# Patient Record
Sex: Male | Born: 1980 | Race: Black or African American | Hispanic: No | Smoking: Current every day smoker
Health system: Southern US, Community
[De-identification: ages and names within clinical notes are randomized; demographics above are authoritative.]

---
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
## 2002-09-08 ENCOUNTER — Encounter: Payer: Self-pay | Admitting: Emergency Medicine

## 2002-09-08 ENCOUNTER — Emergency Department (HOSPITAL_COMMUNITY): Admission: EM | Admit: 2002-09-08 | Discharge: 2002-09-08 | Payer: Self-pay | Admitting: Emergency Medicine

## 2008-10-13 ENCOUNTER — Emergency Department (HOSPITAL_COMMUNITY): Admission: EM | Admit: 2008-10-13 | Discharge: 2008-10-14 | Payer: Self-pay | Admitting: Emergency Medicine

## 2008-10-21 ENCOUNTER — Emergency Department (HOSPITAL_COMMUNITY): Admission: EM | Admit: 2008-10-21 | Discharge: 2008-10-21 | Payer: Self-pay | Admitting: Emergency Medicine

## 2009-09-13 ENCOUNTER — Emergency Department (HOSPITAL_COMMUNITY): Admission: EM | Admit: 2009-09-13 | Discharge: 2009-09-13 | Payer: Self-pay | Admitting: Family Medicine

## 2012-04-01 ENCOUNTER — Encounter (HOSPITAL_COMMUNITY): Payer: Self-pay | Admitting: Emergency Medicine

## 2012-04-01 ENCOUNTER — Emergency Department (HOSPITAL_COMMUNITY)
Admission: EM | Admit: 2012-04-01 | Discharge: 2012-04-01 | Disposition: A | Discharge status: Home / Self Care | Payer: Medicaid Other | Source: Home / Self Care | Attending: Family Medicine | Admitting: Family Medicine

## 2012-04-01 DIAGNOSIS — S335XXA Sprain of ligaments of lumbar spine, initial encounter: Secondary | ICD-10-CM

## 2012-04-01 DIAGNOSIS — S39012A Strain of muscle, fascia and tendon of lower back, initial encounter: Secondary | ICD-10-CM

## 2012-04-01 MED ORDER — IBUPROFEN 800 MG PO TABS: 800.0000 mg | ORAL_TABLET | Freq: Three times a day (TID) | ORAL | Status: DC

## 2012-04-01 MED ORDER — CYCLOBENZAPRINE HCL 5 MG PO TABS: 5.0000 mg | ORAL_TABLET | Freq: Three times a day (TID) | ORAL | Status: DC | PRN

## 2012-04-01 MED ORDER — KETOROLAC TROMETHAMINE 30 MG/ML IJ SOLN
INTRAMUSCULAR | Status: AC
  Filled 2012-04-01: qty 1

## 2012-04-01 MED ORDER — KETOROLAC TROMETHAMINE 30 MG/ML IJ SOLN
60.0000 mg | Freq: Once | INTRAMUSCULAR | Status: AC
  Administered 2012-04-01: 60 mg via INTRAMUSCULAR

## 2012-04-01 NOTE — ED Provider Notes (Signed)
History     CSN: [id number]  Arrival date & time 04/01/12  1129   First MD Initiated Contact with Patient 04/01/12 1145      Chief Complaint  Patient presents with  . Back Pain    (Consider location/radiation/quality/duration/timing/severity/associated sxs/prior treatment) Patient is a 32 y.o. male presenting with back pain. The history is provided by the patient.  Back Pain Location:  Lumbar spine Quality:  Stabbing and shooting Radiates to:  L thigh and R thigh Pain severity:  Mild Onset quality:  Sudden (while hauling trees from yard after cutting yest.) Timing:  Constant Progression:  Unchanged Chronicity:  New Context: lifting heavy objects     History reviewed. No pertinent past medical history.  History reviewed. No pertinent past surgical history.  No family history on file.  History  Substance Use Topics  . Smoking status: Current Every Day Smoker  . Smokeless tobacco: Not on file  . Alcohol Use: Yes      Review of Systems  Constitutional: Negative.   Gastrointestinal: Negative.   Genitourinary: Negative.   Musculoskeletal: Positive for myalgias and back pain. Negative for gait problem.  Skin: Negative.     Allergies  Review of patient's allergies indicates no known allergies.  Home Medications   Current Outpatient Rx  Name  Route  Sig  Dispense  Refill  . OVER THE COUNTER MEDICATION      otc back cream         . cyclobenzaprine (FLEXERIL) 5 MG tablet   Oral   Take 1 tablet (5 mg total) by mouth 3 (three) times daily as needed for muscle spasms.   30 tablet   0   . ibuprofen (ADVIL,MOTRIN) 800 MG tablet   Oral   Take 1 tablet (800 mg total) by mouth 3 (three) times daily.   30 tablet   0     BP 128/82  Pulse 69  Temp(Src) 97.6 F (36.4 C) (Oral)  Resp 18  SpO2 97%  Physical Exam  Nursing note and vitals reviewed. Constitutional: He is oriented to person, place, and time. He appears well-developed and well-nourished.    Abdominal: Soft. Bowel sounds are normal.  Musculoskeletal: He exhibits tenderness.       Lumbar back: He exhibits decreased range of motion, tenderness and spasm. He exhibits normal pulse.       Back:  Neurological: He is alert and oriented to person, place, and time. He has normal reflexes. He displays normal reflexes. He exhibits normal muscle tone.  Skin: Skin is warm and dry.    ED Course  Procedures (including critical care time)  Labs Reviewed - No data to display No results found.   1. Strain of lumbar paraspinal muscle, initial encounter       MDM          Linna Hoff, MD 04/01/12 1259

## 2012-04-01 NOTE — ED Notes (Signed)
C/o back pain, onset yesterday while cutting trees.  Pain in low back and into both legs, but left lower back and left leg is the most painful.  Sharp pain in back apnd leg.  denies any history of back issues.  Denies any urinary symptoms

## 2016-08-01 ENCOUNTER — Ambulatory Visit (HOSPITAL_COMMUNITY)
Admission: EM | Admit: 2016-08-01 | Discharge: 2016-08-01 | Disposition: A | Discharge status: Home / Self Care | Payer: Medicaid Other | Attending: Family Medicine | Admitting: Family Medicine

## 2016-08-01 ENCOUNTER — Encounter (HOSPITAL_COMMUNITY): Payer: Self-pay | Admitting: Emergency Medicine

## 2016-08-01 DIAGNOSIS — K0889 Other specified disorders of teeth and supporting structures: Secondary | ICD-10-CM | POA: Diagnosis not present

## 2016-08-01 DIAGNOSIS — Z202 Contact with and (suspected) exposure to infections with a predominantly sexual mode of transmission: Secondary | ICD-10-CM | POA: Insufficient documentation

## 2016-08-01 DIAGNOSIS — F17200 Nicotine dependence, unspecified, uncomplicated: Secondary | ICD-10-CM | POA: Insufficient documentation

## 2016-08-01 DIAGNOSIS — Z79899 Other long term (current) drug therapy: Secondary | ICD-10-CM | POA: Insufficient documentation

## 2016-08-01 MED ORDER — KETOROLAC TROMETHAMINE 60 MG/2ML IM SOLN
60.0000 mg | Freq: Once | INTRAMUSCULAR | Status: AC
  Administered 2016-08-01: 60 mg via INTRAMUSCULAR

## 2016-08-01 MED ORDER — CEFTRIAXONE SODIUM 250 MG IJ SOLR
250.0000 mg | Freq: Once | INTRAMUSCULAR | Status: AC
  Administered 2016-08-01: 250 mg via INTRAMUSCULAR

## 2016-08-01 MED ORDER — KETOROLAC TROMETHAMINE 60 MG/2ML IM SOLN
INTRAMUSCULAR | Status: AC
  Filled 2016-08-01: qty 2

## 2016-08-01 MED ORDER — AZITHROMYCIN 250 MG PO TABS
1000.0000 mg | ORAL_TABLET | Freq: Once | ORAL | Status: AC
  Administered 2016-08-01: 1000 mg via ORAL

## 2016-08-01 MED ORDER — CEFTRIAXONE SODIUM 250 MG IJ SOLR
INTRAMUSCULAR | Status: AC
  Filled 2016-08-01: qty 250

## 2016-08-01 MED ORDER — AZITHROMYCIN 250 MG PO TABS
ORAL_TABLET | ORAL | Status: AC
  Filled 2016-08-01: qty 4

## 2016-08-01 MED ORDER — MELOXICAM 7.5 MG PO TABS: 7.5000 mg | ORAL_TABLET | Freq: Every day | ORAL | 0 refills | Status: AC

## 2016-08-01 NOTE — ED Notes (Signed)
Call back number verified and updated in EPIC... Adv pt to not have SI until lab results comeback neg.... Also adv pt lab results will be on MyChart; instructions given .... Pt verb understanding.   

## 2016-08-01 NOTE — ED Notes (Signed)
Dirty urine specimen obtained and in lab 
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
## 2016-08-01 NOTE — ED Triage Notes (Signed)
Complains of dental pain.  Pain in top left tooth.  Onset 2 weeks ago Patient denies penile discharge.  Told partner has gonorrhea.

## 2016-08-02 LAB — URINE CYTOLOGY ANCILLARY ONLY
Chlamydia: NEGATIVE
Neisseria Gonorrhea: NEGATIVE
Trichomonas: NEGATIVE

## 2016-11-12 ENCOUNTER — Encounter (HOSPITAL_COMMUNITY): Payer: Self-pay

## 2016-11-12 ENCOUNTER — Emergency Department (HOSPITAL_COMMUNITY)
Admission: EM | Admit: 2016-11-12 | Discharge: 2016-11-12 | Disposition: A | Discharge status: Home / Self Care | Payer: Medicaid Other | Attending: Emergency Medicine | Admitting: Emergency Medicine

## 2016-11-12 DIAGNOSIS — R51 Headache: Secondary | ICD-10-CM | POA: Diagnosis present

## 2016-11-12 DIAGNOSIS — F17200 Nicotine dependence, unspecified, uncomplicated: Secondary | ICD-10-CM | POA: Insufficient documentation

## 2016-11-12 DIAGNOSIS — Z79899 Other long term (current) drug therapy: Secondary | ICD-10-CM | POA: Insufficient documentation

## 2016-11-12 DIAGNOSIS — R55 Syncope and collapse: Secondary | ICD-10-CM | POA: Diagnosis not present

## 2016-11-12 DIAGNOSIS — R519 Headache, unspecified: Secondary | ICD-10-CM

## 2016-11-12 LAB — COMPREHENSIVE METABOLIC PANEL
ALT: 34 U/L (ref 17–63)
AST: 39 U/L (ref 15–41)
Albumin: 3.7 g/dL (ref 3.5–5.0)
Alkaline Phosphatase: 81 U/L (ref 38–126)
Anion gap: 8 (ref 5–15)
BUN: 5 mg/dL — ABNORMAL LOW (ref 6–20)
CO2: 26 mmol/L (ref 22–32)
Calcium: 9.1 mg/dL (ref 8.9–10.3)
Chloride: 104 mmol/L (ref 101–111)
Creatinine, Ser: 1.18 mg/dL (ref 0.61–1.24)
GFR calc Af Amer: >60 mL/min (ref >60)
GFR calc non Af Amer: >60 mL/min (ref >60)
Glucose, Bld: 125 mg/dL — ABNORMAL HIGH (ref 65–99)
Potassium: 3.9 mmol/L (ref 3.5–5.1)
Sodium: 138 mmol/L (ref 135–145)
Total Bilirubin: 0.6 mg/dL (ref 0.3–1.2)
Total Protein: 6.6 g/dL (ref 6.5–8.1)

## 2016-11-12 LAB — CBC
HCT: 43.4 % (ref 39.0–52.0)
Hemoglobin: 14.8 g/dL (ref 13.0–17.0)
MCH: 32 pg (ref 26.0–34.0)
MCHC: 34.1 g/dL (ref 30.0–36.0)
MCV: 93.9 fL (ref 78.0–100.0)
Platelets: 306 10*3/uL (ref 150–400)
RBC: 4.62 MIL/uL (ref 4.22–5.81)
RDW: 14.3 % (ref 11.5–15.5)
WBC: 5.5 10*3/uL (ref 4.0–10.5)

## 2016-11-12 LAB — LIPASE, BLOOD: Lipase: 28 U/L (ref 11–51)

## 2016-11-12 MED ORDER — OXYCODONE-ACETAMINOPHEN 5-325 MG PO TABS
1.0000 | ORAL_TABLET | Freq: Once | ORAL | Status: AC
  Administered 2016-11-12: 1 via ORAL
  Filled 2016-11-12: qty 1

## 2016-11-12 MED ORDER — ONDANSETRON 4 MG PO TBDP
ORAL_TABLET | ORAL | Status: AC
  Filled 2016-11-12: qty 1

## 2016-11-12 MED ORDER — IBUPROFEN 400 MG PO TABS
600.0000 mg | ORAL_TABLET | Freq: Once | ORAL | Status: AC
  Administered 2016-11-12: 13:00:00 600 mg via ORAL
  Filled 2016-11-12: qty 1

## 2016-11-12 MED ORDER — LORAZEPAM 1 MG PO TABS
1.0000 mg | ORAL_TABLET | Freq: Once | ORAL | Status: AC
  Administered 2016-11-12: 1 mg via ORAL
  Filled 2016-11-12: qty 1

## 2016-11-12 MED ORDER — ONDANSETRON 4 MG PO TBDP
4.0000 mg | ORAL_TABLET | Freq: Once | ORAL | Status: AC | PRN
  Administered 2016-11-12: 4 mg via ORAL

## 2016-11-12 NOTE — ED Notes (Signed)
Pt verbalized understanding discharge instructions and denies any further needs or questions at this time. VS stable, ambulatory and steady gait.  Pt informed not to drive since he was given meds.
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
## 2017-07-19 ENCOUNTER — Encounter (HOSPITAL_COMMUNITY): Payer: Self-pay | Admitting: Emergency Medicine

## 2017-07-19 ENCOUNTER — Emergency Department (HOSPITAL_COMMUNITY)
Admission: EM | Admit: 2017-07-19 | Discharge: 2017-07-19 | Disposition: A | Discharge status: Home / Self Care | Payer: Medicaid Other | Attending: Emergency Medicine | Admitting: Emergency Medicine

## 2017-07-19 ENCOUNTER — Other Ambulatory Visit: Payer: Self-pay

## 2017-07-19 DIAGNOSIS — M549 Dorsalgia, unspecified: Secondary | ICD-10-CM | POA: Diagnosis present

## 2017-07-19 DIAGNOSIS — Z79899 Other long term (current) drug therapy: Secondary | ICD-10-CM | POA: Insufficient documentation

## 2017-07-19 DIAGNOSIS — M5442 Lumbago with sciatica, left side: Secondary | ICD-10-CM | POA: Insufficient documentation

## 2017-07-19 DIAGNOSIS — F17210 Nicotine dependence, cigarettes, uncomplicated: Secondary | ICD-10-CM | POA: Insufficient documentation

## 2017-07-19 LAB — CBC WITH DIFFERENTIAL/PLATELET
Abs Immature Granulocytes: 0 10*3/uL (ref 0.0–0.1)
BASOS ABS: 0 10*3/uL (ref 0.0–0.1)
BASOS PCT: 1 %
EOS ABS: 0.2 10*3/uL (ref 0.0–0.7)
Eosinophils Relative: 4 %
HCT: 47.4 % (ref 39.0–52.0)
Hemoglobin: 15.7 g/dL (ref 13.0–17.0)
IMMATURE GRANULOCYTES: 0 %
Lymphocytes Relative: 39 %
Lymphs Abs: 2.1 10*3/uL (ref 0.7–4.0)
MCH: 31 pg (ref 26.0–34.0)
MCHC: 33.1 g/dL (ref 30.0–36.0)
MCV: 93.5 fL (ref 78.0–100.0)
MONOS PCT: 11 %
Monocytes Absolute: 0.6 10*3/uL (ref 0.1–1.0)
NEUTROS PCT: 45 %
Neutro Abs: 2.5 10*3/uL (ref 1.7–7.7)
PLATELETS: 269 10*3/uL (ref 150–400)
RBC: 5.07 MIL/uL (ref 4.22–5.81)
RDW: 13.1 % (ref 11.5–15.5)
WBC: 5.5 10*3/uL (ref 4.0–10.5)

## 2017-07-19 LAB — COMPREHENSIVE METABOLIC PANEL
ALK PHOS: 69 U/L (ref 38–126)
ALT: 54 U/L — AB (ref 0–44)
ANION GAP: 9 (ref 5–15)
AST: 27 U/L (ref 15–41)
Albumin: 3.9 g/dL (ref 3.5–5.0)
BUN: 8 mg/dL (ref 6–20)
CALCIUM: 9.9 mg/dL (ref 8.9–10.3)
CO2: 27 mmol/L (ref 22–32)
CREATININE: 1.27 mg/dL — AB (ref 0.61–1.24)
Chloride: 104 mmol/L (ref 98–111)
Glucose, Bld: 117 mg/dL — ABNORMAL HIGH (ref 70–99)
Potassium: 3.8 mmol/L (ref 3.5–5.1)
Sodium: 140 mmol/L (ref 135–145)
TOTAL PROTEIN: 7.1 g/dL (ref 6.5–8.1)
Total Bilirubin: 0.9 mg/dL (ref 0.3–1.2)

## 2017-07-19 LAB — LIPASE, BLOOD: LIPASE: 39 U/L (ref 11–51)

## 2017-07-19 MED ORDER — NAPROXEN 250 MG PO TABS
500.0000 mg | ORAL_TABLET | Freq: Once | ORAL | Status: AC
  Administered 2017-07-19: 500 mg via ORAL
  Filled 2017-07-19: qty 2

## 2017-07-19 MED ORDER — CYCLOBENZAPRINE HCL 10 MG PO TABS: 10.0000 mg | ORAL_TABLET | Freq: Two times a day (BID) | ORAL | 0 refills | Status: DC | PRN

## 2017-07-19 MED ORDER — NAPROXEN 500 MG PO TABS: 500.0000 mg | ORAL_TABLET | Freq: Two times a day (BID) | ORAL | 0 refills | Status: DC | PRN

## 2017-07-19 MED ORDER — CYCLOBENZAPRINE HCL 10 MG PO TABS
10.0000 mg | ORAL_TABLET | Freq: Once | ORAL | Status: AC
  Administered 2017-07-19: 10 mg via ORAL
  Filled 2017-07-19: qty 1

## 2017-07-19 NOTE — ED Notes (Signed)
During quick look, patient endorses abdominal pain, n/v.  Patient acuity will be upgraded, provided to order labs

## 2017-07-19 NOTE — Discharge Instructions (Signed)
Take Tylenol and naproxen as needed for pain. Use heat and/or muscle relaxant Flexeril for muscle spasm. Follow-up with local physician if no improvement in 2 weeks or if you develop weakness, numbness, bowel or bladder changes or fevers.  If you were given medicines take as directed.  If you are on coumadin or contraceptives realize their levels and effectiveness is altered by many different medicines.  If you have any reaction (rash, tongues swelling, other) to the medicines stop taking and see a physician.    If your blood pressure was elevated in the ER make sure you follow up for management with a primary doctor or return for chest pain, shortness of breath or stroke symptoms.  Please follow up as directed and return to the ER or see a physician for new or worsening symptoms.  Thank you. Vitals:   07/19/17 1431  BP: 140/79  Pulse: 65  Resp: 17  Temp: 98.5 F (36.9 C)  TempSrc: Oral  SpO2: 98%  Weight: 81.6 kg (180 lb)  Height: 5\' 6"  (1.676 m)

## 2017-07-19 NOTE — ED Triage Notes (Signed)
Pt to ER for evaluation of left lateral lumbar back pain radiating into left leg since Wednesday. Reports onset when he was helping someone move. Pt ambulatory. NAD

## 2017-07-19 NOTE — ED Provider Notes (Signed)
MSE was initiated and I personally evaluated the patient and placed orders (if any) at  2:38 PM on July 19, 2017.  The patient appears stable so that the remainder of the MSE may be completed by another provider.  Patient placed in Quick Look pathway, seen and evaluated   Chief Complaint: abdominal pain, back pain  HPI:   Patient presents to ED for 2-day history of left lower back pain.  He also reports right middle and upper quadrant abdominal pain that began this morning.  Several episodes of diarrhea yesterday.  Intermittent nausea.  No sick contacts with similar symptoms.  No suspicious food ingestions.  States that the back pain may have been from him lifting or moving a certain way.  He has had similar symptoms in the past but " this just keeps getting worse."  Denies any numbness in legs, loss of bowel or bladder function, changes in gait, fever.  ROS: Abdominal pain, back pain  Physical Exam:   Gen: No distress  Neuro: Awake and Alert  Skin: Warm    Focused Exam: Tenderness to palpation of the right mid and upper abdomen.  No rebound or guarding.  Left lower lumbar tenderness to palpation at the paraspinal musculature.  No midline tenderness.  No signs of saddle anesthesia or changes in sensation of lower extremities.   Initiation of care has begun. The patient has been counseled on the process, plan, and necessity for staying for the completion/evaluation, and the remainder of the medical screening examination    Dietrich PatesKhatri, Erie Radu, PA-C 07/19/17 1440    Raeford RazorKohut, Stephen, MD 07/21/17 1122

## 2017-07-19 NOTE — ED Provider Notes (Signed)
[facility] EMERGENCY DEPARTMENT Provider Note   CSN: [id number] Arrival date & time: 07/19/17  1423     History   Chief Complaint Chief Complaint  Patient presents with  . Back Pain    HPI Denise Bramblett is a 37 y.o. male.  Patient presents with primarily  left lower back pain with mild radiation down the buttocks and left leg.  This is worsened since moving a garbage bag recently.  Patient had transient right flank pain however primarily left back pain is why he is here.  No fevers or chills.  No vomiting.  No weakness or numbness.  Pain worse with movement.     History reviewed. No pertinent past medical history.  There are no active problems to display for this patient.   History reviewed. No pertinent surgical history.      Home Medications    Prior to Admission medications   Medication Sig Start Date End Date Taking? Authorizing Provider  cyclobenzaprine (FLEXERIL) 10 MG tablet Take 1 tablet (10 mg total) by mouth 2 (two) times daily as needed for muscle spasms. 07/19/17   Blane Ohara, MD  ibuprofen (ADVIL,MOTRIN) 800 MG tablet Take 1 tablet (800 mg total) by mouth 3 (three) times daily. 04/01/12   Linna Hoff, MD  naproxen (NAPROSYN) 500 MG tablet Take 1 tablet (500 mg total) by mouth 2 (two) times daily as needed. 07/19/17   Blane Ohara, MD  OVER THE COUNTER MEDICATION otc back cream    [provider]    Family History History reviewed. No pertinent family history.  Social History Social History   Tobacco Use  . Smoking status: Current Every Day Smoker  . Smokeless tobacco: Never Used  Substance Use Topics  . Alcohol use: Yes  . Drug use: Yes    Types: Marijuana     Allergies   Patient has no known allergies.   Review of Systems Review of Systems  Constitutional: Negative for chills and fever.  HENT: Negative for congestion.   Eyes: Negative for visual disturbance.  Respiratory: Negative for shortness of  breath.   Cardiovascular: Negative for chest pain.  Gastrointestinal: Negative for vomiting.  Genitourinary: Positive for flank pain. Negative for dysuria.  Musculoskeletal: Positive for back pain. Negative for neck pain and neck stiffness.  Skin: Negative for rash.  Neurological: Negative for weakness, light-headedness, numbness and headaches.     Physical Exam Updated Vital Signs BP 140/79 (BP Location: Left Arm)   Pulse 65   Temp 98.5 F (36.9 C) (Oral)   Resp 17   Ht 5\' 6"  (1.676 m)   Wt 81.6 kg (180 lb)   SpO2 98%   BMI 29.05 kg/m   Physical Exam  Constitutional: He is oriented to person, place, and time. He appears well-developed and well-nourished.  HENT:  Head: Normocephalic and atraumatic.  Eyes: Conjunctivae are normal. Right eye exhibits no discharge. Left eye exhibits no discharge.  Neck: Normal range of motion. Neck supple. No tracheal deviation present.  Cardiovascular: Normal rate.  Pulmonary/Chest: Effort normal.  Abdominal: Soft. He exhibits no distension. There is no tenderness. There is no guarding.  Musculoskeletal: He exhibits tenderness. He exhibits no edema.  Patient has tight musculature and tenderness left paraspinal lumbar region, pain with extension of the left leg.  Patient has 5+ strength with flexion-extension at major joints and lower extremities bilateral, sensation intact and palpation major nerve areas, 2+ reflexes lower extremities  Neurological: He is alert and oriented to  person, place, and time.  Skin: Skin is warm. No rash noted.  Psychiatric: He has a normal mood and affect.  Nursing note and vitals reviewed.    ED Treatments / Results  Labs (all labs ordered are listed, but only abnormal results are displayed) Labs Reviewed  COMPREHENSIVE METABOLIC PANEL - Abnormal; Notable for the following components:      Result Value   Glucose, Bld 117 (*)    Creatinine, Ser 1.27 (*)    ALT 54 (*)    All other components within normal  limits  CBC WITH DIFFERENTIAL/PLATELET  LIPASE, BLOOD  URINALYSIS, ROUTINE W REFLEX MICROSCOPIC    EKG None  Radiology No results found.  Procedures Procedures (including critical care time)  Medications Ordered in ED Medications  naproxen (NAPROSYN) tablet 500 mg (has no administration in time range)  cyclobenzaprine (FLEXERIL) tablet 10 mg (has no administration in time range)     Initial Impression / Assessment and Plan / ED Course  I have reviewed the triage vital signs and the nursing notes.  Pertinent labs & imaging results that were available during my care of the patient were reviewed by me and considered in my medical decision making (see chart for details).    Patient presents with clinically sciatica, no weakness or red flags.  Discussed close outpatient follow-up, stretches/physical therapy, anti-inflammatories and muscle relaxant as needed.  Results and differential diagnosis were discussed with the patient/parent/guardian. Xrays were independently reviewed by myself.  Close follow up outpatient was discussed, comfortable with the plan.   Medications  naproxen (NAPROSYN) tablet 500 mg (has no administration in time range)  cyclobenzaprine (FLEXERIL) tablet 10 mg (has no administration in time range)    Vitals:   07/19/17 1431  BP: 140/79  Pulse: 65  Resp: 17  Temp: 98.5 F (36.9 C)  TempSrc: Oral  SpO2: 98%  Weight: 81.6 kg (180 lb)  Height: 5\' 6"  (1.676 m)    Final diagnoses:  Acute back pain with sciatica, left     Final Clinical Impressions(s) / ED Diagnoses   Final diagnoses:  Acute back pain with sciatica, left    ED Discharge Orders        Ordered    cyclobenzaprine (FLEXERIL) 10 MG tablet  2 times daily PRN     07/19/17 1642    naproxen (NAPROSYN) 500 MG tablet  2 times daily PRN     07/19/17 1642       Blane OharaZavitz, Siris Hoos, MD 07/19/17 1645

## 2017-07-19 NOTE — ED Notes (Signed)
Called for patient x 3 with no answer in lobby to attempt for urine sample.  No answer.

## 2017-08-09 ENCOUNTER — Ambulatory Visit: Payer: Self-pay | Admitting: Podiatry

## 2017-10-02 ENCOUNTER — Emergency Department (HOSPITAL_COMMUNITY)
Admission: EM | Admit: 2017-10-02 | Discharge: 2017-10-02 | Disposition: A | Discharge status: Home / Self Care | Payer: Medicaid Other | Attending: Emergency Medicine | Admitting: Emergency Medicine

## 2017-10-02 ENCOUNTER — Emergency Department (HOSPITAL_COMMUNITY): Payer: Medicaid Other

## 2017-10-02 ENCOUNTER — Encounter (HOSPITAL_COMMUNITY): Payer: Self-pay | Admitting: Pharmacy Technician

## 2017-10-02 ENCOUNTER — Other Ambulatory Visit: Payer: Self-pay

## 2017-10-02 DIAGNOSIS — F17210 Nicotine dependence, cigarettes, uncomplicated: Secondary | ICD-10-CM | POA: Diagnosis not present

## 2017-10-02 DIAGNOSIS — E860 Dehydration: Secondary | ICD-10-CM | POA: Insufficient documentation

## 2017-10-02 DIAGNOSIS — R55 Syncope and collapse: Secondary | ICD-10-CM | POA: Diagnosis present

## 2017-10-02 LAB — COMPREHENSIVE METABOLIC PANEL
ALT: 40 U/L (ref 0–44)
AST: 29 U/L (ref 15–41)
Albumin: 3.6 g/dL (ref 3.5–5.0)
Alkaline Phosphatase: 55 U/L (ref 38–126)
Anion gap: 7 (ref 5–15)
BILIRUBIN TOTAL: 0.5 mg/dL (ref 0.3–1.2)
BUN: 11 mg/dL (ref 6–20)
CHLORIDE: 106 mmol/L (ref 98–111)
CO2: 25 mmol/L (ref 22–32)
CREATININE: 1.22 mg/dL (ref 0.61–1.24)
Calcium: 8.8 mg/dL — ABNORMAL LOW (ref 8.9–10.3)
GFR calc non Af Amer: >60 mL/min (ref >60)
Glucose, Bld: 74 mg/dL (ref 70–99)
Potassium: 4 mmol/L (ref 3.5–5.1)
Sodium: 138 mmol/L (ref 135–145)
TOTAL PROTEIN: 6 g/dL — AB (ref 6.5–8.1)

## 2017-10-02 LAB — CBC WITH DIFFERENTIAL/PLATELET
ABS IMMATURE GRANULOCYTES: 0 10*3/uL (ref 0.0–0.1)
BASOS ABS: 0 10*3/uL (ref 0.0–0.1)
BASOS PCT: 0 %
Eosinophils Absolute: 0.1 10*3/uL (ref 0.0–0.7)
Eosinophils Relative: 1 %
HCT: 50.8 % (ref 39.0–52.0)
Hemoglobin: 16.4 g/dL (ref 13.0–17.0)
IMMATURE GRANULOCYTES: 0 %
Lymphocytes Relative: 23 %
Lymphs Abs: 1.7 10*3/uL (ref 0.7–4.0)
MCH: 30.8 pg (ref 26.0–34.0)
MCHC: 32.3 g/dL (ref 30.0–36.0)
MCV: 95.5 fL (ref 78.0–100.0)
MONOS PCT: 8 %
Monocytes Absolute: 0.6 10*3/uL (ref 0.1–1.0)
NEUTROS ABS: 4.8 10*3/uL (ref 1.7–7.7)
Neutrophils Relative %: 68 %
Platelets: 230 10*3/uL (ref 150–400)
RBC: 5.32 MIL/uL (ref 4.22–5.81)
RDW: 13.6 % (ref 11.5–15.5)
WBC: 7.1 10*3/uL (ref 4.0–10.5)

## 2017-10-02 LAB — TROPONIN I

## 2017-10-02 MED ORDER — SODIUM CHLORIDE 0.9 % IV BOLUS
1000.0000 mL | Freq: Once | INTRAVENOUS | Status: AC
  Administered 2017-10-02: 1000 mL via INTRAVENOUS

## 2017-10-02 NOTE — ED Notes (Signed)
Pt ambulated independently with a steady gait. 

## 2017-10-02 NOTE — ED Notes (Signed)
Pt aware of need for urine specimen. 

## 2017-10-02 NOTE — ED Provider Notes (Signed)
[facility] EMERGENCY DEPARTMENT Provider Note   CSN: [id number] Arrival date & time: 10/02/17  1654     History   Chief Complaint Chief Complaint  Patient presents with  . Hypotension    HPI Rodney Nelson is a 37 y.o. male.  HPI   Patient was at home with family members when he was noticed to complain of weakness, fell over, he had been sitting on steps.  Family members state he was alternately responsive, but kept his eyes closed for 20 to 30 minutes.  They also noticed him "foaming at the mouth," during this time.  Patient had donated plasma several hours earlier today.  He admits to ingesting some alcohol prior to the episode.  He was also smoking cigarettes.  No prior similar problems.  He denies chest pain, shortness of breath, headache, neck pain, back pain, nausea or vomiting at this time.  There are no other known modifying factors.  History reviewed. No pertinent past medical history.  There are no active problems to display for this patient.   History reviewed. No pertinent surgical history.      Home Medications    Prior to Admission medications   Medication Sig Start Date End Date Taking? Authorizing Provider  ibuprofen (ADVIL,MOTRIN) 200 MG tablet Take 400 mg by mouth every 6 (six) hours as needed for mild pain.   Yes [provider]  OVER THE COUNTER MEDICATION Apply 1 application topically daily as needed (rash / itching). otc back cream    Yes [provider]  cyclobenzaprine (FLEXERIL) 10 MG tablet Take 1 tablet (10 mg total) by mouth 2 (two) times daily as needed for muscle spasms. Patient not taking: Reported on 10/02/2017 07/19/17   Blane Ohara, MD  ibuprofen (ADVIL,MOTRIN) 800 MG tablet Take 1 tablet (800 mg total) by mouth 3 (three) times daily. Patient not taking: Reported on 10/02/2017 04/01/12   Linna Hoff, MD  naproxen (NAPROSYN) 500 MG tablet Take 1 tablet (500 mg total) by mouth 2 (two) times daily as  needed. Patient not taking: Reported on 10/02/2017 07/19/17   Blane Ohara, MD    Family History No family history on file.  Social History Social History   Tobacco Use  . Smoking status: Current Every Day Smoker  . Smokeless tobacco: Never Used  Substance Use Topics  . Alcohol use: Yes  . Drug use: Yes    Types: Marijuana     Allergies   Patient has no known allergies.   Review of Systems Review of Systems  All other systems reviewed and are negative.    Physical Exam Updated Vital Signs BP 117/66 (BP Location: Right Arm)   Pulse 64   Resp 16   SpO2 98%   Physical Exam  Constitutional: He is oriented to person, place, and time. He appears well-developed and well-nourished. No distress.  HENT:  Head: Normocephalic and atraumatic.  Right Ear: External ear normal.  Left Ear: External ear normal.  Eyes: Pupils are equal, round, and reactive to light. Conjunctivae and EOM are normal.  Neck: Normal range of motion and phonation normal. Neck supple.  Cardiovascular: Normal rate, regular rhythm and normal heart sounds.  Pulmonary/Chest: Effort normal and breath sounds normal. He exhibits no bony tenderness.  Abdominal: Soft. There is no tenderness.  Musculoskeletal: Normal range of motion.  Neurological: He is alert and oriented to person, place, and time. No cranial nerve deficit or sensory deficit. He exhibits normal muscle tone. Coordination normal.  Skin: Skin is warm, dry and intact.  Psychiatric: He has a normal mood and affect. His behavior is normal. Judgment and thought content normal.  Nursing note and vitals reviewed.    ED Treatments / Results  Labs (all labs ordered are listed, but only abnormal results are displayed) Labs Reviewed  COMPREHENSIVE METABOLIC PANEL - Abnormal; Notable for the following components:      Result Value   Calcium 8.8 (*)    Total Protein 6.0 (*)    All other components within normal limits  CBC WITH DIFFERENTIAL/PLATELET   TROPONIN I  RAPID URINE DRUG SCREEN, HOSP PERFORMED  URINALYSIS, ROUTINE W REFLEX MICROSCOPIC    EKG None  Radiology Dg Chest 2 View  Result Date: 10/02/2017 CLINICAL DATA:  Dizziness and near syncope after donating plasma today. EXAM: CHEST - 2 VIEW COMPARISON:  None. FINDINGS: Cardiomediastinal silhouette is normal. No pleural effusions or focal consolidations. Trachea projects midline and there is no pneumothorax. Soft tissue planes and included osseous structures are non-suspicious. IMPRESSION: Normal chest. Electronically Signed   By: Awilda Metro M.D.   On: 10/02/2017 21:00   Ct Head Wo Contrast  Result Date: 10/02/2017 CLINICAL DATA:  Altered level of consciousness, dizziness, near syncope EXAM: CT HEAD WITHOUT CONTRAST TECHNIQUE: Contiguous axial images were obtained from the base of the skull through the vertex without intravenous contrast. COMPARISON:  10/13/2008 FINDINGS: Brain: No acute intracranial abnormality. Specifically, no hemorrhage, hydrocephalus, mass lesion, acute infarction, or significant intracranial injury. Vascular: No hyperdense vessel or unexpected calcification. Skull: No acute calvarial abnormality. Sinuses/Orbits: Visualized paranasal sinuses and mastoids clear. Rounded soft tissue in the posterior right maxillary sinus. Old left orbital medial wall blowout fracture. No acute findings. Other: None IMPRESSION: No acute intracranial abnormality. Electronically Signed   By: Charlett Nose M.D.   On: 10/02/2017 18:36    Procedures Procedures (including critical care time)  Medications Ordered in ED Medications  sodium chloride 0.9 % bolus 1,000 mL (0 mLs Intravenous Stopped 10/02/17 1906)     Initial Impression / Assessment and Plan / ED Course  I have reviewed the triage vital signs and the nursing notes.  Pertinent labs & imaging results that were available during my care of the patient were reviewed by me and considered in my medical decision making  (see chart for details).  Clinical Course as of Oct 03 2131  Wed Oct 02, 2017  2053 normal  Troponin I [EW]  2053 normal  CBC with Differential [EW]  2053 Normal except decreased calcium and t. protein  Comprehensive metabolic panel(!) [EW]    Clinical Course User Index [EW] Mancel Bale, MD     Patient Vitals for the past 24 hrs:  BP Pulse Resp SpO2  10/02/17 2030 117/66 64 16 98 %  10/02/17 1915 - (!) 56 14 99 %  10/02/17 1900 - 60 15 100 %  10/02/17 1800 124/73 64 18 97 %  10/02/17 1730 117/72 (!) 59 20 100 %  10/02/17 1715 117/69 60 16 100 %    9:32 PM Reevaluation with update and discussion. After initial assessment and treatment, an updated evaluation reveals patient is comfortable, ambulating easily and has tolerated oral nutrition and hydration.  Discussed and questions answered. Mancel Bale   Medical Decision Making: Apparent syncope associated with volume depletion post plasma donation.  Patient improved after treatment and stable for discharge.  Doubt serious bacterial infection, metabolic instability or impending vascular collapse.   CRITICAL CARE-no Performed by: Mancel Bale  Nursing Notes Reviewed/ Care Coordinated Applicable Imaging Reviewed Interpretation of Laboratory Data incorporated into ED treatment  The patient appears reasonably screened and/or stabilized for discharge and I doubt any other medical condition or other [facility] requiring further screening, evaluation, or treatment in the ED at this time prior to discharge.  Plan: Home Medications-OTC, as needed; Home Treatments-rest, fluids; return here if the recommended treatment, does not improve the symptoms; Recommended follow up- PCP, PRN     Final Clinical Impressions(s) / ED Diagnoses   Final diagnoses:  Dehydration    ED Discharge Orders    None       Mancel Bale, MD 10/02/17 2134

## 2017-10-02 NOTE — ED Notes (Signed)
Patient verbalizes understanding of discharge instructions. Opportunity for questioning and answers were provided. Ambulatory at discharge in NAD.  

## 2017-10-02 NOTE — ED Notes (Signed)
Pt again reminded of need for urine sample. States unable to void at this time.

## 2017-10-02 NOTE — Discharge Instructions (Addendum)
The testing today is reassuring.  You are likely dehydrated from the plasma donation causing you to pass out.  Make sure that you eat 3 meals a day and drink 1 to 2 L of water daily.  Try to avoid using alcohol or tobacco products at this time.

## 2017-10-02 NOTE — ED Notes (Signed)
In addition to lavender and light green, dark green top was also collected and put on rocker in [facility] in case an I-Stat order is placed.
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
## 2018-04-15 ENCOUNTER — Other Ambulatory Visit: Payer: Self-pay

## 2018-04-15 ENCOUNTER — Ambulatory Visit: Payer: Medicaid Other | Admitting: Sports Medicine

## 2018-04-15 ENCOUNTER — Encounter: Payer: Self-pay | Admitting: Sports Medicine

## 2018-04-15 VITALS — Temp 97.7°F

## 2018-04-15 DIAGNOSIS — L853 Xerosis cutis: Secondary | ICD-10-CM

## 2018-04-15 DIAGNOSIS — B359 Dermatophytosis, unspecified: Secondary | ICD-10-CM

## 2018-04-15 DIAGNOSIS — M79673 Pain in unspecified foot: Secondary | ICD-10-CM

## 2018-04-15 DIAGNOSIS — M216X1 Other acquired deformities of right foot: Secondary | ICD-10-CM | POA: Diagnosis not present

## 2018-04-15 DIAGNOSIS — L819 Disorder of pigmentation, unspecified: Secondary | ICD-10-CM

## 2018-04-15 MED ORDER — KETOCONAZOLE 2 % EX CREA: 1.0000 "application " | TOPICAL_CREAM | Freq: Every day | CUTANEOUS | 5 refills | Status: DC

## 2018-04-15 MED ORDER — ALUMINUM CHLORIDE 20 % EX SOLN: Freq: Every day | CUTANEOUS | 5 refills | Status: DC

## 2018-04-15 NOTE — Progress Notes (Signed)
Subjective: Rodney Nelson is a 38 y.o. male patient who presents to office for evaluation of bilateral foot pain secondary to dry scaling skin. Patient complains of changes to skin and noticing dark spots to both feet for years.  Patient has tried OTC meds with no relief in symptoms. Patient denies any other pedal complaints.   Review of Systems  Skin: Positive for itching.  All other systems reviewed and are negative.    There are no active problems to display for this patient.   Current Outpatient Medications on File Prior to Visit  Medication Sig Dispense Refill  . cyclobenzaprine (FLEXERIL) 10 MG tablet Take 1 tablet (10 mg total) by mouth 2 (two) times daily as needed for muscle spasms. 10 tablet 0  . ibuprofen (ADVIL,MOTRIN) 200 MG tablet Take 400 mg by mouth every 6 (six) hours as needed for mild pain.    Marland Kitchen ibuprofen (ADVIL,MOTRIN) 800 MG tablet Take 1 tablet (800 mg total) by mouth 3 (three) times daily. 30 tablet 0  . naproxen (NAPROSYN) 500 MG tablet Take 1 tablet (500 mg total) by mouth 2 (two) times daily as needed. 20 tablet 0  . OVER THE COUNTER MEDICATION Apply 1 application topically daily as needed (rash / itching). otc back cream      No current facility-administered medications on file prior to visit.     No Known Allergies  Objective:  General: Alert and oriented x3 in no acute distress  Dermatology: Scaly patches of hyperpigmented skin bilateral in a mocassion disturbtion resembling Tinea, no pain is present with direct pressure to areas, no webspace macerations, no ecchymosis bilateral, all nails x 10 are well manicured.  Vascular: Dorsalis Pedis and Posterior Tibial pedal pulses 1/4, Capillary Fill Time 3 seconds, + pedal hair growth bilateral, no edema bilateral lower extremities, Temperature gradient within normal limits.  Neurology: Michaell Cowing sensation intact via light touch bilateral.  Musculoskeletal: No tenderness with palpation bilateral, +Pes planus  bilateral, Muscular strength 5/5 in all groups without pain or limitation on range of motion.   Assessment and Plan: Problem List Items Addressed This Visit    None    Visit Diagnoses    Tinea    -  Primary   Relevant Medications   ketoconazole (NIZORAL) 2 % cream   Hyperpigmentation       Dry skin       Pain of foot, unspecified laterality         -Complete examination performed -Discussed treatment options -Rx Nizoral to use daily and Drysol to use at bedtime -Encouraged daily skin emollients -Encouraged use of pumice stone PRN  -Advised good supportive shoes and inserts -Patient to return to office in 1 month for med check or sooner if condition worsens. If no improvement will add on LFTs for PO lamisil.  Asencion Islam, DPM

## 2018-09-24 ENCOUNTER — Ambulatory Visit (HOSPITAL_COMMUNITY)
Admission: EM | Admit: 2018-09-24 | Discharge: 2018-09-24 | Disposition: A | Discharge status: Home / Self Care | Payer: Medicaid Other | Attending: Family Medicine | Admitting: Family Medicine

## 2018-09-24 ENCOUNTER — Encounter (HOSPITAL_COMMUNITY): Payer: Self-pay

## 2018-09-24 ENCOUNTER — Other Ambulatory Visit: Payer: Self-pay

## 2018-09-24 DIAGNOSIS — K529 Noninfective gastroenteritis and colitis, unspecified: Secondary | ICD-10-CM

## 2018-09-24 MED ORDER — ONDANSETRON HCL 4 MG PO TABS: 4.0000 mg | ORAL_TABLET | Freq: Four times a day (QID) | ORAL | 0 refills | Status: DC

## 2018-09-24 NOTE — ED Triage Notes (Signed)
Pt presents with nausea and diarrhea since last night; pt states his girlfriend had a stomach virus over the weekend.

## 2018-09-24 NOTE — ED Provider Notes (Signed)
[facility]    CSN: [id number] Arrival date & time: 09/24/18  1518      History   Chief Complaint Chief Complaint  Patient presents with  . Nausea  . Diarrhea    HPI Rodney Nelson is a 38 y.o. male.   Patient presents with nausea and diarrhea x1 day.  He states he has had 2 episodes of diarrhea today.  He denies vomiting, fever, chills, abdominal pain, or other symptoms.  He states his girlfriend had the same symptoms 3 to 4 days ago.  No treatments attempted at home.    The history is provided by the patient.    History reviewed. No pertinent past medical history.  There are no active problems to display for this patient.   History reviewed. No pertinent surgical history.     Home Medications    Prior to Admission medications   Medication Sig Start Date End Date Taking? Authorizing Provider  aluminum chloride (DRYSOL) 20 % external solution Apply topically at bedtime. 04/15/18   Asencion IslamStover, Titorya, DPM  cyclobenzaprine (FLEXERIL) 10 MG tablet Take 1 tablet (10 mg total) by mouth 2 (two) times daily as needed for muscle spasms. 07/19/17   Blane OharaZavitz, Joshua, MD  ibuprofen (ADVIL,MOTRIN) 200 MG tablet Take 400 mg by mouth every 6 (six) hours as needed for mild pain.    [provider]  ibuprofen (ADVIL,MOTRIN) 800 MG tablet Take 1 tablet (800 mg total) by mouth 3 (three) times daily. 04/01/12   Linna HoffKindl, James D, MD  ketoconazole (NIZORAL) 2 % cream Apply 1 application topically daily. 04/15/18   Asencion IslamStover, Titorya, DPM  naproxen (NAPROSYN) 500 MG tablet Take 1 tablet (500 mg total) by mouth 2 (two) times daily as needed. 07/19/17   Blane OharaZavitz, Joshua, MD  ondansetron (ZOFRAN) 4 MG tablet Take 1 tablet (4 mg total) by mouth every 6 (six) hours. 09/24/18   Mickie Bailate, Khristina Janota H, NP  OVER THE COUNTER MEDICATION Apply 1 application topically daily as needed (rash / itching). otc back cream     [provider]    Family History Family History  Family history unknown: Yes     Social History Social History   Tobacco Use  . Smoking status: Current Every Day Smoker  . Smokeless tobacco: Never Used  Substance Use Topics  . Alcohol use: Yes  . Drug use: Yes    Types: Marijuana     Allergies   Patient has no known allergies.   Review of Systems Review of Systems  Constitutional: Negative for chills and fever.  HENT: Negative for ear pain and sore throat.   Eyes: Negative for pain and visual disturbance.  Respiratory: Negative for cough and shortness of breath.   Cardiovascular: Negative for chest pain and palpitations.  Gastrointestinal: Positive for diarrhea and nausea. Negative for abdominal pain and vomiting.  Genitourinary: Negative for dysuria and hematuria.  Musculoskeletal: Negative for arthralgias and back pain.  Skin: Negative for color change and rash.  Neurological: Negative for seizures and syncope.  All other systems reviewed and are negative.    Physical Exam Triage Vital Signs ED Triage Vitals  Enc Vitals Group     BP      Pulse      Resp      Temp      Temp src      SpO2      Weight      Height      Head Circumference  Peak Flow      Pain Score      Pain Loc      Pain Edu?      Excl. in [place]?    No data found.  Updated Vital Signs BP 121/79 (BP Location: Left Arm)   Pulse 78   Temp 98.9 F (37.2 C) (Oral)   Resp 17   SpO2 95%   Visual Acuity Right Eye Distance:   Left Eye Distance:   Bilateral Distance:    Right Eye Near:   Left Eye Near:    Bilateral Near:     Physical Exam Vitals signs and nursing note reviewed.  Constitutional:      General: He is not in acute distress.    Appearance: He is well-developed. He is not ill-appearing.  HENT:     Head: Normocephalic and atraumatic.     Mouth/Throat:     Mouth: Mucous membranes are moist.     Pharynx: Oropharynx is clear.  Eyes:     Conjunctiva/sclera: Conjunctivae normal.  Neck:     Musculoskeletal: Neck supple.  Cardiovascular:     Rate and  Rhythm: Normal rate and regular rhythm.     Heart sounds: No murmur.  Pulmonary:     Effort: Pulmonary effort is normal. No respiratory distress.     Breath sounds: Normal breath sounds.  Abdominal:     General: Bowel sounds are normal.     Palpations: Abdomen is soft.     Tenderness: There is no abdominal tenderness. There is no guarding or rebound.  Skin:    General: Skin is warm and dry.     Findings: No rash.  Neurological:     Mental Status: He is alert.      UC Treatments / Results  Labs (all labs ordered are listed, but only abnormal results are displayed) Labs Reviewed - No data to display  EKG   Radiology No results found.  Procedures Procedures (including critical care time)  Medications Ordered in UC Medications - No data to display  Initial Impression / Assessment and Plan / UC Course  I have reviewed the triage vital signs and the nursing notes.  Pertinent labs & imaging results that were available during my care of the patient were reviewed by me and considered in my medical decision making (see chart for details).   Gastroenteritis.  Patient is well-appearing and his exam is unremarkable.  Treating with Zofran as needed.  Instructed patient to hydrate with clear liquids.  Instructed him to return here or go to the emergency department if he has worsening symptoms or develops fever, chills, vomiting, abdominal pain, or other concerns.  Patient agrees with plan of care.     Final Clinical Impressions(s) / UC Diagnoses   Final diagnoses:  Gastroenteritis     Discharge Instructions     Take the Zofran as needed for nausea.    Keep yourself hydrated with clear liquids such as Sprite, ginger ale, or water.    Return here or go to the emergency room if you have worsening symptoms or develop new symptoms such as fever, chills, vomiting, abdominal pain, or other concerns.        ED Prescriptions    Medication Sig Dispense Auth. Provider    ondansetron (ZOFRAN) 4 MG tablet Take 1 tablet (4 mg total) by mouth every 6 (six) hours. 12 tablet Sharion Balloon, NP     Controlled Substance Prescriptions [place] Controlled Substance Registry consulted? Not Applicable  Mickie Bail, NP 09/24/18 1547

## 2018-09-24 NOTE — Discharge Instructions (Signed)
Take the Zofran as needed for nausea.    Keep yourself hydrated with clear liquids such as Sprite, ginger ale, or water.    Return here or go to the emergency room if you have worsening symptoms or develop new symptoms such as fever, chills, vomiting, abdominal pain, or other concerns.

## 2019-04-24 ENCOUNTER — Other Ambulatory Visit: Payer: Self-pay

## 2019-04-24 ENCOUNTER — Encounter (HOSPITAL_COMMUNITY): Payer: Self-pay

## 2019-04-24 ENCOUNTER — Ambulatory Visit (HOSPITAL_COMMUNITY)
Admission: EM | Admit: 2019-04-24 | Discharge: 2019-04-24 | Disposition: A | Discharge status: Home / Self Care | Payer: Medicaid Other | Attending: Internal Medicine | Admitting: Internal Medicine

## 2019-04-24 DIAGNOSIS — F17210 Nicotine dependence, cigarettes, uncomplicated: Secondary | ICD-10-CM | POA: Diagnosis not present

## 2019-04-24 DIAGNOSIS — Z8249 Family history of ischemic heart disease and other diseases of the circulatory system: Secondary | ICD-10-CM | POA: Insufficient documentation

## 2019-04-24 DIAGNOSIS — J069 Acute upper respiratory infection, unspecified: Secondary | ICD-10-CM | POA: Insufficient documentation

## 2019-04-24 DIAGNOSIS — Z823 Family history of stroke: Secondary | ICD-10-CM | POA: Insufficient documentation

## 2019-04-24 DIAGNOSIS — U071 COVID-19: Secondary | ICD-10-CM | POA: Diagnosis not present

## 2019-04-24 MED ORDER — CETIRIZINE HCL 10 MG PO CAPS: 10.0000 mg | ORAL_CAPSULE | Freq: Every day | ORAL | 0 refills | Status: DC

## 2019-04-24 MED ORDER — DM-GUAIFENESIN ER 30-600 MG PO TB12: 1.0000 | ORAL_TABLET | Freq: Two times a day (BID) | ORAL | 0 refills | Status: DC

## 2019-04-24 MED ORDER — IBUPROFEN 800 MG PO TABS: 800.0000 mg | ORAL_TABLET | Freq: Three times a day (TID) | ORAL | 0 refills | Status: DC

## 2019-04-24 MED ORDER — BENZONATATE 200 MG PO CAPS: 200.0000 mg | ORAL_CAPSULE | Freq: Three times a day (TID) | ORAL | 0 refills | Status: AC | PRN

## 2019-04-24 NOTE — ED Provider Notes (Signed)
[facility]    CSN: [id number] Arrival date & time: 04/24/19  1355      History   Chief Complaint Chief Complaint  Patient presents with  . multiple symptoms    HPI Rodney Nelson is a 39 y.o. male history of tobacco use presenting today for evaluation of URI symptoms.  Patient notes that over the past 2 days he has had cough, congestion, fatigue.  He has also noticed headaches, body aches as well as some slight discomfort in his right eye.  He has been using some NyQuil and ibuprofen without relief.  He reports his significant other has also been sick with similar symptoms.  He is mainly here for Covid testing.  He denies any known fevers.  Denies chest pain or shortness of breath.  Denies GI symptoms.  HPI  History reviewed. No pertinent past medical history.  There are no problems to display for this patient.   History reviewed. No pertinent surgical history.     Home Medications    Prior to Admission medications   Medication Sig Start Date End Date Taking? Authorizing Provider  benzonatate (TESSALON) 200 MG capsule Take 1 capsule (200 mg total) by mouth 3 (three) times daily as needed for up to 7 days for cough. 04/24/19 05/01/19  Patrik Turnbaugh C, PA-C  Cetirizine HCl 10 MG CAPS Take 1 capsule (10 mg total) by mouth daily for 10 days. 04/24/19 05/04/19  Kori Colin C, PA-C  dextromethorphan-guaiFENesin (MUCINEX DM) 30-600 MG 12hr tablet Take 1 tablet by mouth 2 (two) times daily. 04/24/19   Iria Jamerson C, PA-C  ibuprofen (ADVIL) 800 MG tablet Take 1 tablet (800 mg total) by mouth 3 (three) times daily. 04/24/19   Lucero Auzenne C, PA-C  ketoconazole (NIZORAL) 2 % cream Apply 1 application topically daily. 04/15/18   Asencion Islam, DPM  OVER THE COUNTER MEDICATION Apply 1 application topically daily as needed (rash / itching). otc back cream     [provider]    Family History Family History  Problem Relation Age of Onset  . Hypertension Mother    . Stroke Mother   . Hypertension Father     Social History Social History   Tobacco Use  . Smoking status: Current Every Day Smoker    Packs/day: 0.50    Types: Cigarettes  . Smokeless tobacco: Never Used  Substance Use Topics  . Alcohol use: Yes    Comment: occ  . Drug use: Yes    Types: Marijuana     Allergies   Patient has no known allergies.   Review of Systems Review of Systems  Constitutional: Positive for appetite change and fatigue. Negative for activity change, chills and fever.  HENT: Positive for congestion, rhinorrhea and sore throat. Negative for ear pain, sinus pressure and trouble swallowing.   Eyes: Negative for discharge and redness.  Respiratory: Positive for cough. Negative for chest tightness and shortness of breath.   Cardiovascular: Negative for chest pain.  Gastrointestinal: Negative for abdominal pain, diarrhea, nausea and vomiting.  Musculoskeletal: Positive for myalgias.  Skin: Negative for rash.  Neurological: Positive for headaches. Negative for dizziness and light-headedness.     Physical Exam Triage Vital Signs ED Triage Vitals  Enc Vitals Group     BP 04/24/19 1459 135/81     Pulse Rate 04/24/19 1459 (!) 54     Resp 04/24/19 1459 16     Temp 04/24/19 1459 98.3 F (36.8 C)     Temp Source  04/24/19 1459 Oral     SpO2 04/24/19 1459 100 %     Weight 04/24/19 1500 184 lb (83.5 kg)     Height 04/24/19 1500 5\' 6"  (1.676 m)     Head Circumference --      Peak Flow --      Pain Score 04/24/19 1459 7     Pain Loc --      Pain Edu? --      Excl. in GC? --    No data found.  Updated Vital Signs BP 135/81   Pulse (!) 54   Temp 98.3 F (36.8 C) (Oral)   Resp 16   Ht 5\' 6"  (1.676 m)   Wt 184 lb (83.5 kg)   SpO2 100%   BMI 29.70 kg/m   Visual Acuity Right Eye Distance:   Left Eye Distance:   Bilateral Distance:    Right Eye Near:   Left Eye Near:    Bilateral Near:     Physical Exam Vitals and nursing note reviewed.    Constitutional:      Appearance: He is well-developed.     Comments: No acute distress  HENT:     Head: Normocephalic and atraumatic.     Ears:     Comments: Bilateral ears without tenderness to palpation of external auricle, tragus and mastoid, EAC's without erythema or swelling, TM's with good bony landmarks and cone of light. Non erythematous.     Nose: Nose normal.     Mouth/Throat:     Comments: Oral mucosa pink and moist, no tonsillar enlargement or exudate. Posterior pharynx patent and nonerythematous, no uvula deviation or swelling. Normal phonation. Eyes:     Conjunctiva/sclera: Conjunctivae normal.  Cardiovascular:     Rate and Rhythm: Normal rate.  Pulmonary:     Effort: Pulmonary effort is normal. No respiratory distress.     Comments: Breathing comfortably at rest, CTABL, no wheezing, rales or other adventitious sounds auscultated Abdominal:     General: There is no distension.  Musculoskeletal:        General: Normal range of motion.     Cervical back: Neck supple.  Skin:    General: Skin is warm and dry.  Neurological:     Mental Status: He is alert and oriented to person, place, and time.      UC Treatments / Results  Labs (all labs ordered are listed, but only abnormal results are displayed) Labs Reviewed  SARS CORONAVIRUS 2 (TAT 6-24 HRS)    EKG   Radiology No results found.  Procedures Procedures (including critical care time)  Medications Ordered in UC Medications - No data to display  Initial Impression / Assessment and Plan / UC Course  I have reviewed the triage vital signs and the nursing notes.  Pertinent labs & imaging results that were available during my care of the patient were reviewed by me and considered in my medical decision making (see chart for details).     Covid PCR pending.  2 days of URI symptoms, exam unremarkable, recommending symptomatic and supportive care as likely viral etiology.  Providing cetirizine, Mucinex DM  and Tessalon.  Continue Tylenol and ibuprofen for headache and body aches.  Rest and push fluids.  Provided work note.   Discussed strict return precautions. Patient verbalized understanding and is agreeable with plan.  Final Clinical Impressions(s) / UC Diagnoses   Final diagnoses:  Viral URI with cough     Discharge Instructions  Covid test pending, monitor my chart for results Begin daily cetirizine to help with congestion and postnasal drainage Mucinex DM twice daily for further congestion/cough Tessalon/benzonatate every 8 hours as needed for cough Ibuprofen and Tylenol for headache, body aches and fever Rest and drink plenty of fluids  Please follow-up if any symptoms not improving or worsening, developing difficulty breathing, worsening chest pain or shortness of breath   ED Prescriptions    Medication Sig Dispense Auth. Provider   ibuprofen (ADVIL) 800 MG tablet Take 1 tablet (800 mg total) by mouth 3 (three) times daily. 21 tablet Mykalah Saari C, PA-C   Cetirizine HCl 10 MG CAPS Take 1 capsule (10 mg total) by mouth daily for 10 days. 10 capsule Olsen Mccutchan C, PA-C   dextromethorphan-guaiFENesin (MUCINEX DM) 30-600 MG 12hr tablet Take 1 tablet by mouth 2 (two) times daily. 20 tablet Oscar Hank C, PA-C   benzonatate (TESSALON) 200 MG capsule Take 1 capsule (200 mg total) by mouth 3 (three) times daily as needed for up to 7 days for cough. 28 capsule Jermy Couper, [place] C, PA-C     PDMP not reviewed this encounter.   Janith Lima, [place] 04/24/19 2124

## 2019-04-24 NOTE — Discharge Instructions (Signed)
Covid test pending, monitor my chart for results Begin daily cetirizine to help with congestion and postnasal drainage Mucinex DM twice daily for further congestion/cough Tessalon/benzonatate every 8 hours as needed for cough Ibuprofen and Tylenol for headache, body aches and fever Rest and drink plenty of fluids  Please follow-up if any symptoms not improving or worsening, developing difficulty breathing, worsening chest pain or shortness of breath

## 2019-04-24 NOTE — ED Triage Notes (Signed)
Pt c/o HA, right eye pain, body aches, nasal congestion, drowsyx2 days. Pt wants COVID test.

## 2019-04-25 LAB — SARS CORONAVIRUS 2 (TAT 6-24 HRS): SARS Coronavirus 2: POSITIVE — AB

## 2019-05-21 IMAGING — CT CT HEAD W/O CM
4 series · 16 of 47 positions shown, 18 images · non-contrast
Comparison: 10/13/2008

CLINICAL DATA: Altered level of consciousness, dizziness, near
syncope

EXAM:
CT HEAD WITHOUT CONTRAST
TECHNIQUE: Contiguous axial images were obtained from the base of the skull
through the vertex without intravenous contrast.

[Series 3: head wo · axial · 0.44mm/px · z∈[-160,-40]mm · 7 of 32 slices shown, 9 images]
[im 4/32  brain]
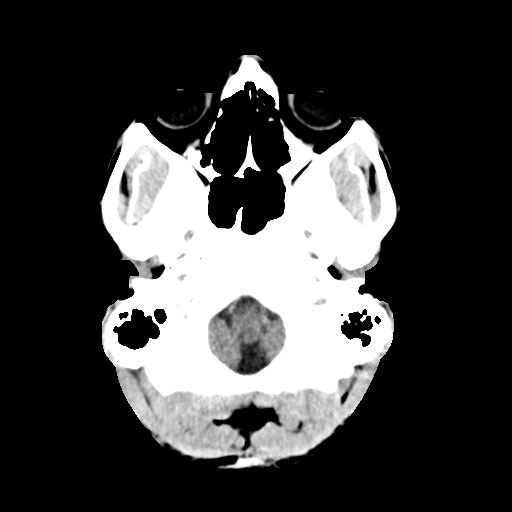
[im 4/32  bone]
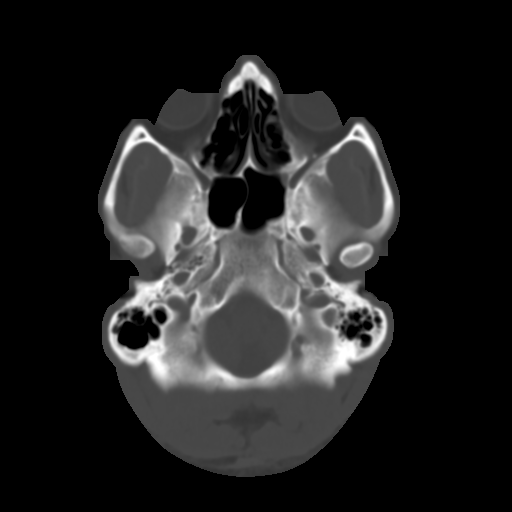
[im 8/32  brain]
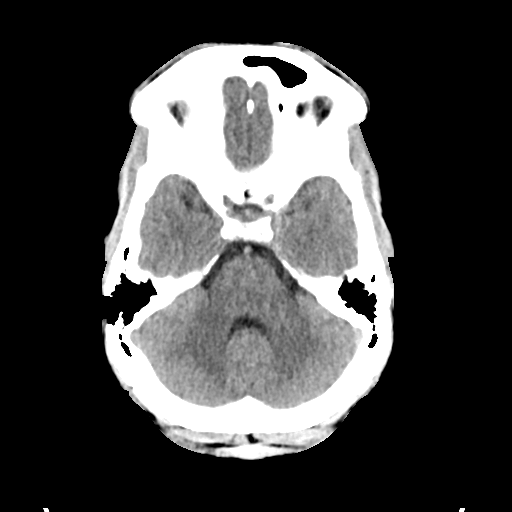
[im 12/32  brain]
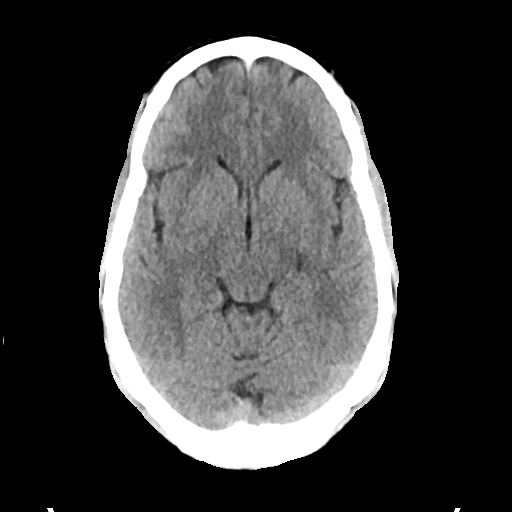
[im 16/32  brain]
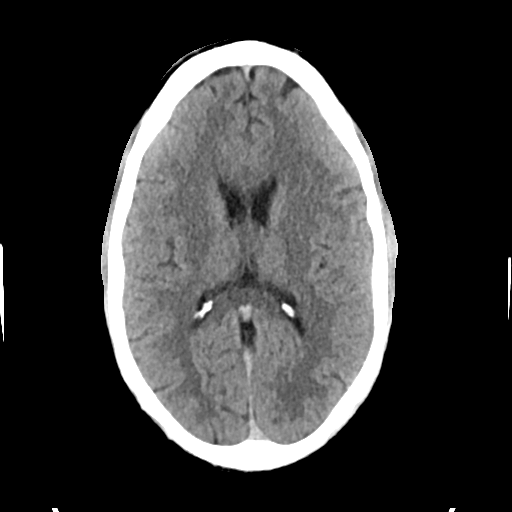
[im 20/32  brain]
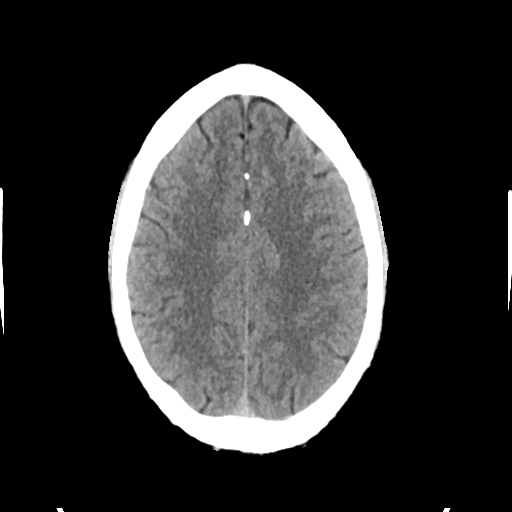
[im 20/32  bone]
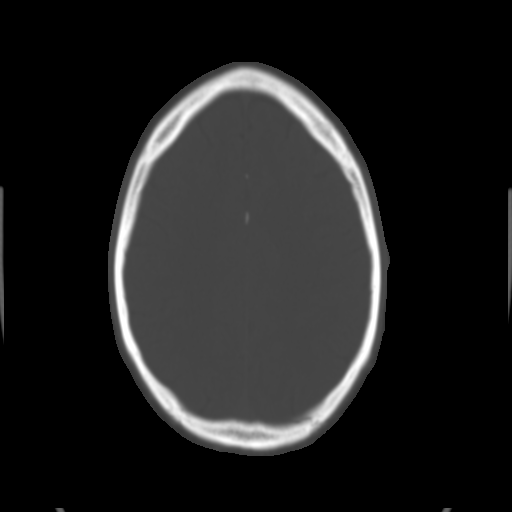
[im 24/32  brain]
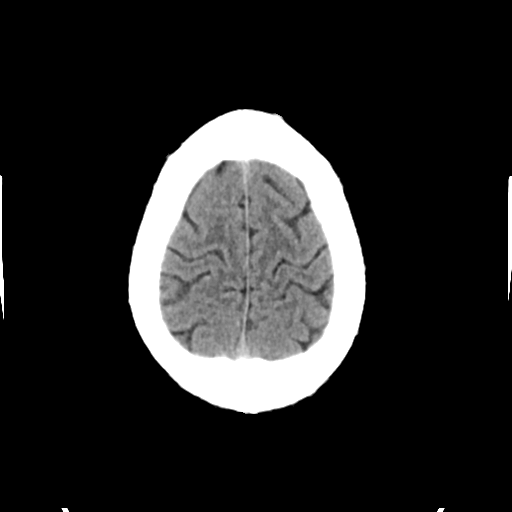
[im 28/32  brain]
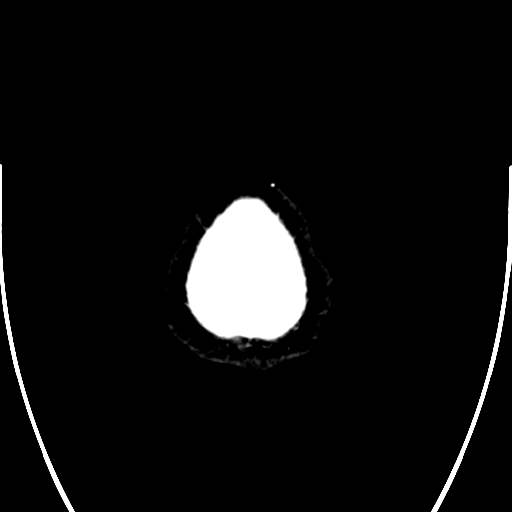

[Series 4: head bone · axial · 0.44mm/px · z∈[-160,-128]mm · 3 of 80 slices shown]
[im 8/80  bone]
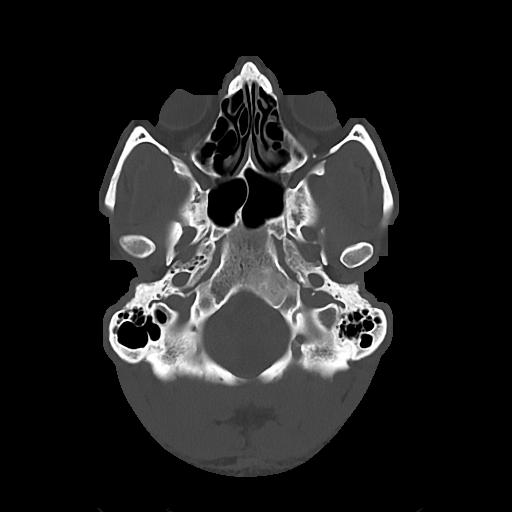
[im 16/80  bone]
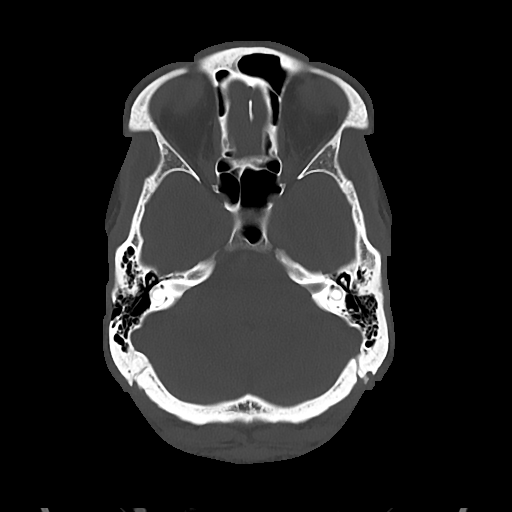
[im 24/80  bone]
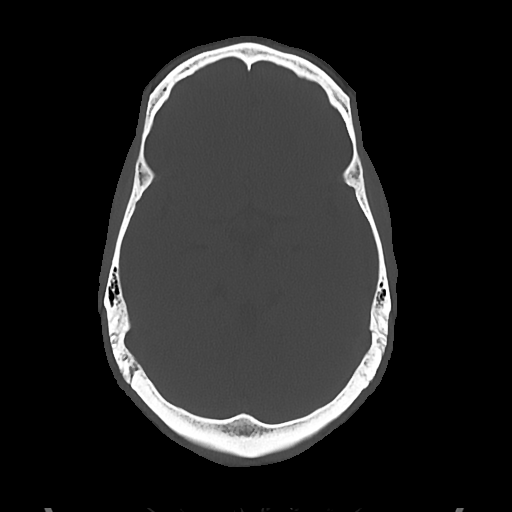

[Series 5: cor soft · coronal · 0.31mm/px · 3 of 74 slices shown]
[im 25/74  brain]
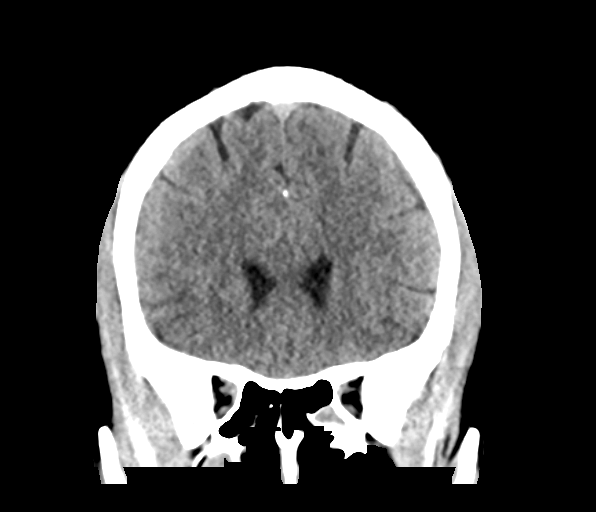
[im 33/74  brain]
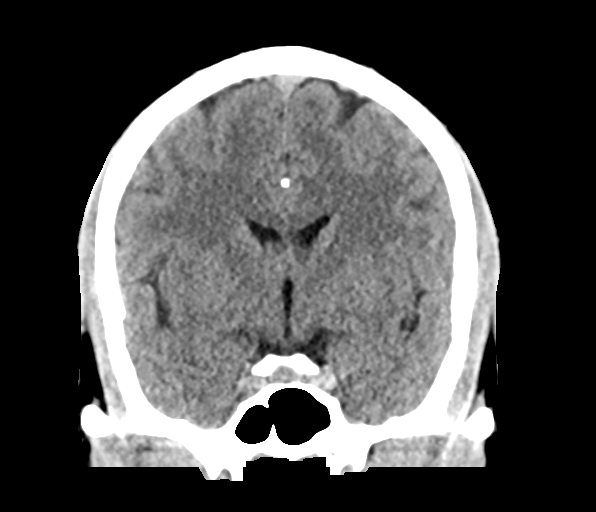
[im 41/74  brain]
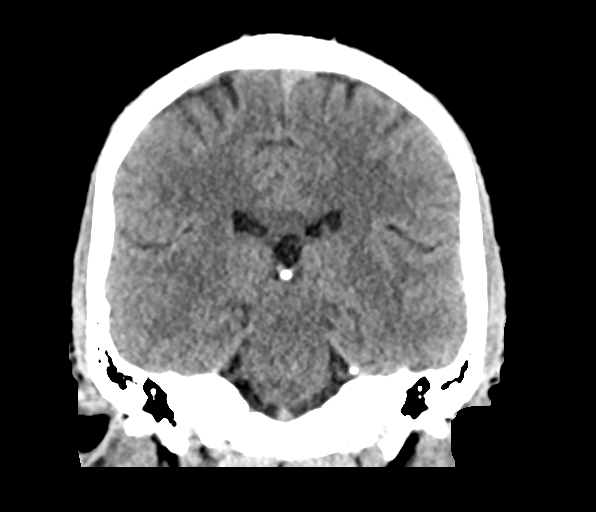

[Series 6: sag soft · sagittal · 0.31mm/px · 3 of 67 slices shown]
[im 23/67  brain]
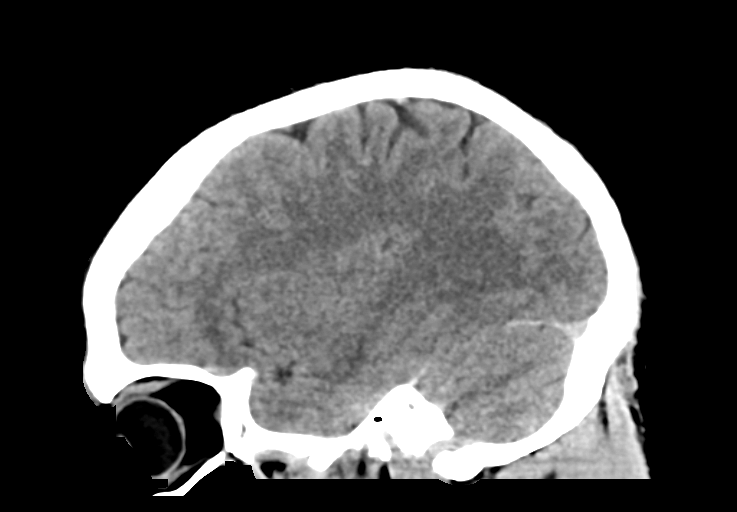
[im 34/67  brain]
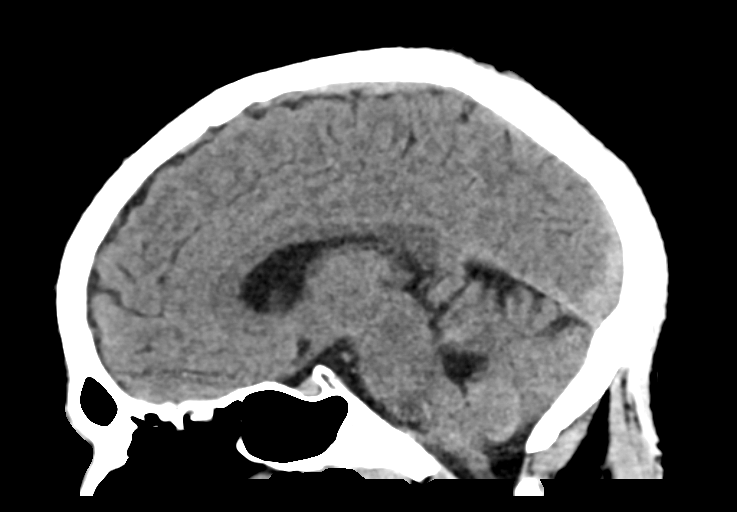
[im 45/67  brain]
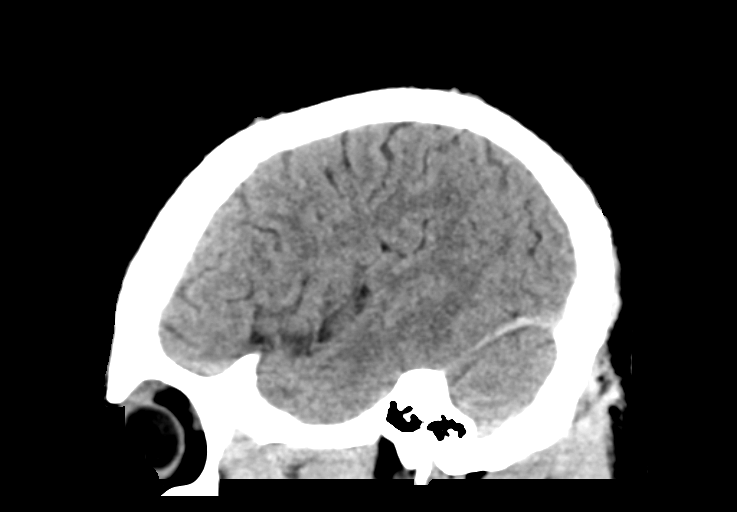

[16 of 47 positions shown; findings below may reference images not displayed]

FINDINGS: Brain: No acute intracranial abnormality. Specifically, no
hemorrhage, hydrocephalus, mass lesion, acute infarction, or
significant intracranial injury.

Vascular: No hyperdense vessel or unexpected calcification.

Skull: No acute calvarial abnormality.

Sinuses/Orbits: Visualized paranasal sinuses and mastoids clear.
Rounded soft tissue in the posterior right maxillary sinus. Old left
orbital medial wall blowout fracture. No acute findings.

Other: None
IMPRESSION: No acute intracranial abnormality.

## 2019-09-08 ENCOUNTER — Ambulatory Visit: Payer: Medicaid Other

## 2020-01-16 ENCOUNTER — Ambulatory Visit (HOSPITAL_COMMUNITY)
Admission: EM | Admit: 2020-01-16 | Discharge: 2020-01-16 | Disposition: A | Discharge status: Home / Self Care | Payer: Medicaid Other | Attending: Physician Assistant | Admitting: Physician Assistant

## 2020-01-16 ENCOUNTER — Other Ambulatory Visit: Payer: Self-pay

## 2020-01-16 ENCOUNTER — Encounter (HOSPITAL_COMMUNITY): Payer: Self-pay | Admitting: Physician Assistant

## 2020-01-16 DIAGNOSIS — U071 COVID-19: Secondary | ICD-10-CM | POA: Diagnosis not present

## 2020-01-16 DIAGNOSIS — B349 Viral infection, unspecified: Secondary | ICD-10-CM

## 2020-01-16 NOTE — ED Provider Notes (Signed)
[facility]    CSN: [id number] Arrival date & time: 01/16/20  1343      History   Chief Complaint No chief complaint on file.   HPI Rodney Nelson is a 40 y.o. male.   Presents with 2 day history of dry cough, malaise, body aches and diarrhea. No fevers that he is aware. No known exposures, and he had one vaccine only.      History reviewed. No pertinent past medical history.  There are no problems to display for this patient.   History reviewed. No pertinent surgical history.     Home Medications    Prior to Admission medications   Medication Sig Start Date End Date Taking? Authorizing Provider  Cetirizine HCl 10 MG CAPS Take 1 capsule (10 mg total) by mouth daily for 10 days. 04/24/19 05/04/19  Wieters, Hallie C, PA-C  dextromethorphan-guaiFENesin (MUCINEX DM) 30-600 MG 12hr tablet Take 1 tablet by mouth 2 (two) times daily. 04/24/19   Wieters, Hallie C, PA-C  ibuprofen (ADVIL) 800 MG tablet Take 1 tablet (800 mg total) by mouth 3 (three) times daily. 04/24/19   Wieters, Hallie C, PA-C  ketoconazole (NIZORAL) 2 % cream Apply 1 application topically daily. 04/15/18   Asencion Islam, DPM  OVER THE COUNTER MEDICATION Apply 1 application topically daily as needed (rash / itching). otc back cream     [provider]    Family History Family History  Problem Relation Age of Onset  . Hypertension Mother   . Stroke Mother   . Hypertension Father     Social History Social History   Tobacco Use  . Smoking status: Current Every Day Smoker    Packs/day: 0.50    Types: Cigarettes  . Smokeless tobacco: Never Used  Substance Use Topics  . Alcohol use: Yes    Comment: occ  . Drug use: Yes    Types: Marijuana     Allergies   Patient has no known allergies.   Review of Systems Review of Systems  All other systems reviewed and are negative.    Physical Exam Triage Vital Signs ED Triage Vitals  Enc Vitals Group     BP      Pulse      Resp       Temp      Temp src      SpO2      Weight      Height      Head Circumference      Peak Flow      Pain Score      Pain Loc      Pain Edu?      Excl. in GC?    No data found.  Updated Vital Signs There were no vitals taken for this visit.  Visual Acuity Right Eye Distance:   Left Eye Distance:   Bilateral Distance:    Right Eye Near:   Left Eye Near:    Bilateral Near:     Physical Exam Constitutional:      General: He is not in acute distress.    Appearance: Normal appearance. He is not ill-appearing, toxic-appearing or diaphoretic.  HENT:     Head: Normocephalic and atraumatic.     Mouth/Throat:     Mouth: Mucous membranes are moist.     Pharynx: No oropharyngeal exudate or posterior oropharyngeal erythema.  Eyes:     General: No scleral icterus. Cardiovascular:     Rate and Rhythm: Normal rate and  regular rhythm.  Pulmonary:     Effort: Pulmonary effort is normal.     Breath sounds: Normal breath sounds.  Lymphadenopathy:     Cervical: No cervical adenopathy.  Skin:    General: Skin is warm and dry.     Findings: No rash.  Neurological:     General: No focal deficit present.     Mental Status: He is alert.  Psychiatric:        Mood and Affect: Mood normal.      UC Treatments / Results  Labs (all labs ordered are listed, but only abnormal results are displayed) Labs Reviewed - No data to display  EKG   Radiology No results found.  Procedures Procedures (including critical care time)  Medications Ordered in UC Medications - No data to display  Initial Impression / Assessment and Plan / UC Course  I have reviewed the triage vital signs and the nursing notes.  Pertinent labs & imaging results that were available during my care of the patient were reviewed by me and considered in my medical decision making (see chart for details).     Treat symptomatically for viral illness with supportive care. FU if worsens. Work note given.  Final  Clinical Impressions(s) / UC Diagnoses   Final diagnoses:  Viral illness     Discharge Instructions     Most likely a viral illness. Treat symptoms with Motrin 800 mg very 8 hours in conjunction with Tylenol as needed, rest and fluids. Will call if Covid is positive or check on my chart. Hope you feel better.    ED Prescriptions    None     PDMP not reviewed this encounter.   Riki Sheer, PA-C 02/13/20 1005

## 2020-01-16 NOTE — ED Triage Notes (Signed)
Pt states that he is having diarrhea, left side pain, rib pain, sore throat, nasal congestion, sweats, and fever. Pt states sx started Tuesday.

## 2020-01-16 NOTE — Discharge Instructions (Addendum)
Most likely a viral illness. Treat symptoms with Motrin 800 mg very 8 hours in conjunction with Tylenol as needed, rest and fluids. Will call if Covid is positive or check on my chart. Hope you feel better.

## 2020-01-17 LAB — SARS CORONAVIRUS 2 (TAT 6-24 HRS): SARS Coronavirus 2: POSITIVE — AB

## 2020-01-18 ENCOUNTER — Other Ambulatory Visit: Payer: Medicaid Other

## 2020-01-18 ENCOUNTER — Other Ambulatory Visit: Payer: Self-pay

## 2020-08-29 ENCOUNTER — Ambulatory Visit (HOSPITAL_COMMUNITY)
Admission: EM | Admit: 2020-08-29 | Discharge: 2020-08-29 | Disposition: A | Discharge status: Home / Self Care | Payer: Medicaid Other | Attending: Family Medicine | Admitting: Family Medicine

## 2020-08-29 ENCOUNTER — Encounter (HOSPITAL_COMMUNITY): Payer: Self-pay

## 2020-08-29 ENCOUNTER — Other Ambulatory Visit: Payer: Self-pay

## 2020-08-29 DIAGNOSIS — R369 Urethral discharge, unspecified: Secondary | ICD-10-CM | POA: Insufficient documentation

## 2020-08-29 DIAGNOSIS — R300 Dysuria: Secondary | ICD-10-CM | POA: Insufficient documentation

## 2020-08-29 MED ORDER — CEFTRIAXONE SODIUM 500 MG IJ SOLR
INTRAMUSCULAR | Status: AC
  Filled 2020-08-29: qty 500

## 2020-08-29 MED ORDER — DOXYCYCLINE HYCLATE 100 MG PO CAPS: 100.0000 mg | ORAL_CAPSULE | Freq: Two times a day (BID) | ORAL | 0 refills | Status: DC

## 2020-08-29 MED ORDER — LIDOCAINE HCL (PF) 1 % IJ SOLN
INTRAMUSCULAR | Status: AC
  Filled 2020-08-29: qty 2

## 2020-08-29 MED ORDER — CEFTRIAXONE SODIUM 500 MG IJ SOLR
500.0000 mg | Freq: Once | INTRAMUSCULAR | Status: AC
  Administered 2020-08-29: 500 mg via INTRAMUSCULAR

## 2020-08-29 NOTE — ED Provider Notes (Signed)
[facility]    CSN: [id number] Arrival date & time: 08/29/20  1042      History   Chief Complaint Chief Complaint  Patient presents with   burning  during urination   std testing    HPI Mateo Overbeck is a 40 y.o. male.   Patient presenting today with 2-day history of dysuria, penile discharge.  Denies hematuria, fever, chills, abdominal pain, nausea vomiting or diarrhea.  Not trying thing over-the-counter for symptoms.  Had this once before recently and was seen by his PCP and diagnosed with gonorrhea and chlamydia.  Had sexual contact with the same partner that he had issues with in the past.   History reviewed. No pertinent past medical history.  There are no problems to display for this patient.   History reviewed. No pertinent surgical history.     Home Medications    Prior to Admission medications   Medication Sig Start Date End Date Taking? Authorizing Provider  doxycycline (VIBRAMYCIN) 100 MG capsule Take 1 capsule (100 mg total) by mouth 2 (two) times daily. 08/29/20  Yes Particia Nearing, PA-C  Cetirizine HCl 10 MG CAPS Take 1 capsule (10 mg total) by mouth daily for 10 days. 04/24/19 05/04/19  Wieters, Hallie C, PA-C  dextromethorphan-guaiFENesin (MUCINEX DM) 30-600 MG 12hr tablet Take 1 tablet by mouth 2 (two) times daily. 04/24/19   Wieters, Hallie C, PA-C  ibuprofen (ADVIL) 800 MG tablet Take 1 tablet (800 mg total) by mouth 3 (three) times daily. 04/24/19   Wieters, Hallie C, PA-C  ketoconazole (NIZORAL) 2 % cream Apply 1 application topically daily. 04/15/18   Asencion Islam, DPM  OVER THE COUNTER MEDICATION Apply 1 application topically daily as needed (rash / itching). otc back cream     [provider]    Family History Family History  Problem Relation Age of Onset   Hypertension Mother    Stroke Mother    Hypertension Father     Social History Social History   Tobacco Use   Smoking status: Every Day    Packs/day: 0.50     Types: Cigarettes   Smokeless tobacco: Never  Substance Use Topics   Alcohol use: Yes    Comment: occ   Drug use: Yes    Types: Marijuana     Allergies   Patient has no known allergies.   Review of Systems Review of Systems Per HPI  Physical Exam Triage Vital Signs ED Triage Vitals  Enc Vitals Group     BP 08/29/20 1159 124/73     Pulse Rate 08/29/20 1159 74     Resp 08/29/20 1159 20     Temp 08/29/20 1159 98.6 F (37 C)     Temp Source 08/29/20 1159 Oral     SpO2 08/29/20 1159 100 %     Weight --      Height --      Head Circumference --      Peak Flow --      Pain Score 08/29/20 1158 0     Pain Loc --      Pain Edu? --      Excl. in GC? --    No data found.  Updated Vital Signs BP 124/73 (BP Location: Right Arm)   Pulse 74   Temp 98.6 F (37 C) (Oral)   Resp 20   SpO2 100%   Visual Acuity Right Eye Distance:   Left Eye Distance:   Bilateral Distance:  Right Eye Near:   Left Eye Near:    Bilateral Near:     Physical Exam Vitals and nursing note reviewed.  Constitutional:      Appearance: Normal appearance.  HENT:     Head: Atraumatic.     Mouth/Throat:     Mouth: Mucous membranes are moist.     Pharynx: Oropharynx is clear.  Eyes:     Extraocular Movements: Extraocular movements intact.     Conjunctiva/sclera: Conjunctivae normal.  Cardiovascular:     Rate and Rhythm: Normal rate and regular rhythm.  Pulmonary:     Effort: Pulmonary effort is normal.     Breath sounds: Normal breath sounds.  Abdominal:     General: Bowel sounds are normal. There is no distension.     Palpations: Abdomen is soft.     Tenderness: There is no abdominal tenderness. There is no guarding.  Genitourinary:    Comments: GU exam deferred, self swab performed Musculoskeletal:        General: Normal range of motion.     Cervical back: Normal range of motion and neck supple.  Skin:    General: Skin is warm and dry.     Findings: No erythema or rash.   Neurological:     General: No focal deficit present.     Mental Status: He is oriented to person, place, and time.  Psychiatric:        Mood and Affect: Mood normal.        Thought Content: Thought content normal.        Judgment: Judgment normal.     UC Treatments / Results  Labs (all labs ordered are listed, but only abnormal results are displayed) Labs Reviewed  CYTOLOGY, (ORAL, ANAL, URETHRAL) ANCILLARY ONLY    EKG   Radiology No results found.  Procedures Procedures (including critical care time)  Medications Ordered in UC Medications  cefTRIAXone (ROCEPHIN) injection 500 mg (500 mg Intramuscular Given 08/29/20 1252)    Initial Impression / Assessment and Plan / UC Course  I have reviewed the triage vital signs and the nursing notes.  Pertinent labs & imaging results that were available during my care of the patient were reviewed by me and considered in my medical decision making (see chart for details).     Cytology swab pending, given history of gonorrhea and chlamydia from same partner and similar symptoms, will cover for both with IM Rocephin, doxycycline while awaiting results.  Discussed abstinence until results return and at least 7 days posttreatment.  Follow-up if worsening or not resolving.  Final Clinical Impressions(s) / UC Diagnoses   Final diagnoses:  Dysuria  Penile discharge   Discharge Instructions   None    ED Prescriptions     Medication Sig Dispense Auth. Provider   doxycycline (VIBRAMYCIN) 100 MG capsule Take 1 capsule (100 mg total) by mouth 2 (two) times daily. 14 capsule Particia Nearing, [place]      PDMP not reviewed this encounter.   Particia Nearing, [place] 08/29/20 1254

## 2020-08-29 NOTE — ED Triage Notes (Signed)
Pt In with c/o burning during urination  States he knows what it is and would like a STD test

## 2020-08-30 LAB — CYTOLOGY, (ORAL, ANAL, URETHRAL) ANCILLARY ONLY
Chlamydia: NEGATIVE
Comment: NEGATIVE
Comment: NEGATIVE
Comment: NORMAL
Neisseria Gonorrhea: POSITIVE — AB
Trichomonas: NEGATIVE

## 2022-02-04 ENCOUNTER — Emergency Department (HOSPITAL_COMMUNITY): Payer: Medicaid Other

## 2022-02-04 ENCOUNTER — Emergency Department (HOSPITAL_COMMUNITY)
Admission: EM | Admit: 2022-02-04 | Discharge: 2022-02-04 | Disposition: A | Discharge status: Home / Self Care | Payer: Medicaid Other | Attending: Emergency Medicine | Admitting: Emergency Medicine

## 2022-02-04 ENCOUNTER — Encounter (HOSPITAL_COMMUNITY): Payer: Self-pay

## 2022-02-04 ENCOUNTER — Other Ambulatory Visit: Payer: Self-pay

## 2022-02-04 DIAGNOSIS — R404 Transient alteration of awareness: Secondary | ICD-10-CM | POA: Diagnosis not present

## 2022-02-04 DIAGNOSIS — R4182 Altered mental status, unspecified: Secondary | ICD-10-CM | POA: Diagnosis present

## 2022-02-04 DIAGNOSIS — E871 Hypo-osmolality and hyponatremia: Secondary | ICD-10-CM | POA: Diagnosis not present

## 2022-02-04 DIAGNOSIS — F1990 Other psychoactive substance use, unspecified, uncomplicated: Secondary | ICD-10-CM | POA: Diagnosis not present

## 2022-02-04 LAB — URINALYSIS, ROUTINE W REFLEX MICROSCOPIC
Bacteria, UA: NONE SEEN
Bilirubin Urine: NEGATIVE
Glucose, UA: NEGATIVE mg/dL
Hgb urine dipstick: NEGATIVE
Ketones, ur: NEGATIVE mg/dL
Leukocytes,Ua: NEGATIVE
Nitrite: NEGATIVE
Protein, ur: 30 mg/dL — AB
Specific Gravity, Urine: 1.024 (ref 1.005–1.030)
pH: 5 (ref 5.0–8.0)

## 2022-02-04 LAB — RAPID URINE DRUG SCREEN, HOSP PERFORMED
Amphetamines: POSITIVE — AB
Barbiturates: NOT DETECTED
Benzodiazepines: NOT DETECTED
Cocaine: POSITIVE — AB
Opiates: NOT DETECTED
Tetrahydrocannabinol: POSITIVE — AB

## 2022-02-04 LAB — CBC
HCT: 42.9 % (ref 39.0–52.0)
Hemoglobin: 13.6 g/dL (ref 13.0–17.0)
MCH: 30.4 pg (ref 26.0–34.0)
MCHC: 31.7 g/dL (ref 30.0–36.0)
MCV: 96 fL (ref 80.0–100.0)
Platelets: 307 10*3/uL (ref 150–400)
RBC: 4.47 MIL/uL (ref 4.22–5.81)
RDW: 13.7 % (ref 11.5–15.5)
WBC: 8.5 10*3/uL (ref 4.0–10.5)
nRBC: 0 % (ref 0.0–0.2)

## 2022-02-04 LAB — LACTIC ACID, PLASMA: Lactic Acid, Venous: 0.8 mmol/L (ref 0.5–1.9)

## 2022-02-04 LAB — CBG MONITORING, ED: Glucose-Capillary: 91 mg/dL (ref 70–99)

## 2022-02-04 LAB — COMPREHENSIVE METABOLIC PANEL
ALT: 38 U/L (ref 0–44)
AST: 72 U/L — ABNORMAL HIGH (ref 15–41)
Albumin: 4 g/dL (ref 3.5–5.0)
Alkaline Phosphatase: 101 U/L (ref 38–126)
Anion gap: 11 (ref 5–15)
BUN: 16 mg/dL (ref 6–20)
CO2: 20 mmol/L — ABNORMAL LOW (ref 22–32)
Calcium: 9.1 mg/dL (ref 8.9–10.3)
Chloride: 101 mmol/L (ref 98–111)
Creatinine, Ser: 0.85 mg/dL (ref 0.61–1.24)
GFR, Estimated: >60 mL/min (ref >60)
Glucose, Bld: 100 mg/dL — ABNORMAL HIGH (ref 70–99)
Potassium: 3.9 mmol/L (ref 3.5–5.1)
Sodium: 132 mmol/L — ABNORMAL LOW (ref 135–145)
Total Bilirubin: 0.7 mg/dL (ref 0.3–1.2)
Total Protein: 9.6 g/dL — ABNORMAL HIGH (ref 6.5–8.1)

## 2022-02-04 LAB — AMMONIA: Ammonia: 57 umol/L — ABNORMAL HIGH (ref 9–35)

## 2022-02-04 LAB — ETHANOL: Alcohol, Ethyl (B): <10 mg/dL (ref <10)

## 2022-02-04 LAB — CK
Total CK: 1236 U/L — ABNORMAL HIGH (ref 49–397)
Total CK: 879 U/L — ABNORMAL HIGH (ref 49–397)

## 2022-02-04 MED ORDER — NALOXONE HCL 4 MG/0.1ML NA LIQD
1.0000 | Freq: Once | NASAL | Status: AC
  Administered 2022-02-04: 1 via NASAL
  Filled 2022-02-04: qty 4

## 2022-02-04 MED ORDER — LACTATED RINGERS IV BOLUS
1000.0000 mL | Freq: Once | INTRAVENOUS | Status: AC
  Administered 2022-02-04: 1000 mL via INTRAVENOUS

## 2022-02-04 NOTE — ED Triage Notes (Signed)
[place] called ems for unresponsive.  Ems arrives and patient is alert to self.  responsive to verbal stimuli Sluggish pupils and hematoma to back of head noted in triage.

## 2022-02-04 NOTE — ED Notes (Signed)
ETOH noted on breath

## 2022-02-04 NOTE — Discharge Instructions (Addendum)
You were seen in the ER today for evaluation after you were found unresponsive.  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  Please make sure to drink plenty of water and stay well-hydrated.  Please stop taking recreational drugs.  If you have any concerns, new or worsening symptoms, please return to the nearest emergency department for evaluation.

## 2022-02-04 NOTE — ED Provider Triage Note (Signed)
Emergency Medicine Provider Triage Evaluation Note  Rodney Nelson , a 42 y.o. male  was evaluated in triage.  Pt complains of altered mental status and hematoma.  Patient was found unresponsive by bystanders presenting via EMS.  Nurses noted pupils are sluggish and there is hematoma on the back of his head.  Patient is able to protect his airway at this time.  No history was able to be obtained.  Review of Systems  Positive: Unable to be obtained Negative: Unable to be obtained  Physical Exam  BP 123/84 (BP Location: Left Arm)   Pulse 72   Resp 18   Wt 83 kg   SpO2 95%   BMI 29.53 kg/m  Gen:   Somnolent but able to protect airway Resp:  Normal effort MSK:   Was unable to check Head:  I did not note a hematoma on exam  Medical Decision Making  Medically screening exam initiated at 8:30 AM.  Appropriate orders placed.  Rodney Nelson was informed that the remainder of the evaluation will be completed by another provider, this initial triage assessment does not replace that evaluation, and the importance of remaining in the ED until their evaluation is complete.  Narcan, CT head and neck, labs   Rodney Nelson 02/04/22 0981

## 2022-02-07 NOTE — ED Provider Notes (Signed)
[name] [facility] Provider Note   CSN: [id number] Arrival date & time: 02/04/22  9485     History Chief Complaint  Patient presents with   Altered Mental Status    Rodney Nelson is a 42 y.o. male presents to the ER for evaluation of AMS.  Per the EMS run sheet, the patient was [place] the bus stop.  He would not respond to questions however will answer his name and birthdate.  He was found to be itching all over and not able to sit still.  EMS marks a history of drug use.  He was alert.  Level 5 caveat due to patient's nonverbal behavior.   Altered Mental Status      Home Medications Prior to Admission medications   Medication Sig Start Date End Date Taking? Authorizing Provider  Cetirizine HCl 10 MG CAPS Take 1 capsule (10 mg total) by mouth daily for 10 days. 04/24/19 05/04/19  Wieters, Hallie C, PA-C  dextromethorphan-guaiFENesin (MUCINEX DM) 30-600 MG 12hr tablet Take 1 tablet by mouth 2 (two) times daily. 04/24/19   Wieters, Hallie C, PA-C  doxycycline (VIBRAMYCIN) 100 MG capsule Take 1 capsule (100 mg total) by mouth 2 (two) times daily. 08/29/20   Volney American, PA-C  ibuprofen (ADVIL) 800 MG tablet Take 1 tablet (800 mg total) by mouth 3 (three) times daily. 04/24/19   Wieters, Hallie C, PA-C  ketoconazole (NIZORAL) 2 % cream Apply 1 application topically daily. 04/15/18   Landis Martins, DPM  OVER THE COUNTER MEDICATION Apply 1 application topically daily as needed (rash / itching). otc back cream     [provider]      Allergies    Patient has no known allergies.    Review of Systems   Review of Systems  Unable to perform ROS: Patient nonverbal    Physical Exam Updated Vital Signs BP 101/80   Pulse 68   Temp 98.7 F (37.1 C) (Oral)   Resp 16   Wt 83 kg   SpO2 99%   BMI 29.53 kg/m  Physical Exam Vitals and nursing note reviewed.  Constitutional:      Comments: Constantly moving around, and itching  himself   Eyes:     Comments: Pupils are symmetric, but slightly sluggish  Cardiovascular:     Rate and Rhythm: Normal rate.  Pulmonary:     Effort: Pulmonary effort is normal.     Breath sounds: Normal breath sounds.  Abdominal:     Palpations: Abdomen is soft.     Tenderness: There is no abdominal tenderness.  Musculoskeletal:     Cervical back: Normal range of motion. No rigidity.     Comments: Moving all extremities  Skin:    General: Skin is warm and dry.  Neurological:     Mental Status: He is alert.     ED Results / Procedures / Treatments   Labs (all labs ordered are listed, but only abnormal results are displayed) Labs Reviewed  COMPREHENSIVE METABOLIC PANEL - Abnormal; Notable for the following components:      Result Value   Sodium 132 (*)    CO2 20 (*)    Glucose, Bld 100 (*)    Total Protein 9.6 (*)    AST 72 (*)    All other components within normal limits  RAPID URINE DRUG SCREEN, HOSP PERFORMED - Abnormal; Notable for the following components:   Cocaine POSITIVE (*)    Amphetamines POSITIVE (*)  Tetrahydrocannabinol POSITIVE (*)    All other components within normal limits  URINALYSIS, ROUTINE W REFLEX MICROSCOPIC - Abnormal; Notable for the following components:   Protein, ur 30 (*)    All other components within normal limits  AMMONIA - Abnormal; Notable for the following components:   Ammonia 57 (*)    All other components within normal limits  CK - Abnormal; Notable for the following components:   Total CK 1,236 (*)    All other components within normal limits  CK - Abnormal; Notable for the following components:   Total CK 879 (*)    All other components within normal limits  CBC  ETHANOL  LACTIC ACID, PLASMA  CBG MONITORING, ED    EKG None  Radiology DG Chest Portable 1 View  Result Date: 02/04/2022 CLINICAL DATA:  Found unresponsive EXAM: PORTABLE CHEST 1 VIEW COMPARISON:  10/02/2017 FINDINGS: The heart size and mediastinal  contours are within normal limits. Both lungs are clear. The visualized skeletal structures are unremarkable. IMPRESSION: No active disease. Electronically Signed   By: Alcide Clever M.D.   On: 02/04/2022 17:01   CT Cervical Spine Wo Contrast  Result Date: 02/04/2022 CLINICAL DATA:  42 year old male found down outside. EXAM: CT CERVICAL SPINE WITHOUT CONTRAST TECHNIQUE: Multidetector CT imaging of the cervical spine was performed without intravenous contrast. Multiplanar CT image reconstructions were also generated. RADIATION DOSE REDUCTION: This exam was performed according to the departmental dose-optimization program which includes automated exposure control, adjustment of the mA and/or kV according to patient size and/or use of iterative reconstruction technique. COMPARISON:  Head CT today. FINDINGS: Alignment: Right head rotation with levoconvex scoliosis or rightward flexion of the neck. Mild straightening of cervical lordosis. Cervicothoracic junction alignment is within normal limits. Bilateral posterior element alignment is within normal limits. Skull base and vertebrae: Visualized skull base is intact. No atlanto-occipital dissociation. C1 and C2 appear intact and aligned. Intermittent mild motion artifact. No acute osseous abnormality identified. Soft tissues and spinal canal: No prevertebral fluid or swelling. No visible canal hematoma. Negative noncontrast visible neck soft tissues. Disc levels: C5-C6 disc degeneration with vacuum disc and endplate spurring. No definite cervical spinal stenosis by CT. Upper chest: Left apex mild paraseptal emphysema. Visible upper thoracic levels appear intact. IMPRESSION: 1. No acute traumatic injury identified in the cervical spine. 2. Chronic C5-C6 disc degeneration. 3. Mild left apical Emphysema. Electronically Signed   By: Odessa Fleming M.D.   On: 02/04/2022 10:02   CT Head Wo Contrast  Result Date: 02/04/2022 CLINICAL DATA:  42 year old male with altered mental  status, found down outside. EXAM: CT HEAD WITHOUT CONTRAST TECHNIQUE: Contiguous axial images were obtained from the base of the skull through the vertex without intravenous contrast. RADIATION DOSE REDUCTION: This exam was performed according to the departmental dose-optimization program which includes automated exposure control, adjustment of the mA and/or kV according to patient size and/or use of iterative reconstruction technique. COMPARISON:  Head CT 10/02/2017. FINDINGS: Brain: Cerebral volume remains normal. No midline shift, ventriculomegaly, mass effect, evidence of mass lesion, intracranial hemorrhage or evidence of cortically based acute infarction. Gray-white matter differentiation is within normal limits throughout the brain. Vascular: No suspicious intracranial vascular hyperdensity. Skull: No acute osseous abnormality identified. Sinuses/Orbits: Widespread new paranasal sinus mucosal thickening and fluid. Conspicuous anterior nasal septal perforation. Retained secretions in the nasal cavity. Tympanic cavities and mastoids are clear. Other: Disconjugate gaze. No orbit or scalp soft tissue injury identified. IMPRESSION: 1. Normal noncontrast CT appearance of  the brain. 2. Widespread acute appearing paranasal sinus inflammation. Anterior nasal septal perforation which is nonspecific and can be the sequelae of trauma, granulomatous disease, toxic exposure (cocaine, chronic decongestants). Electronically Signed   By: Genevie Ann M.D.   On: 02/04/2022 09:56     Procedures Procedures   Medications Ordered in ED Medications  naloxone (NARCAN) nasal spray 4 mg/0.1 mL (1 spray Nasal Provided for home use 02/04/22 0905)  lactated ringers bolus 1,000 mL (0 mLs Intravenous Stopped 02/04/22 1348)  lactated ringers bolus 1,000 mL (0 mLs Intravenous Stopped 02/04/22 1605)    ED Course/ Medical Decision Making/ A&P Clinical Course as of 02/07/22 0947  Sun Feb 04, 2022  1051 Attempted to call mom x2, calling  restrictions. No opportunity to leave voicemail.  [RR]  1052 Attempted to call other numbers on the chart without answer. [RR]  1055 HIPAA compliant voicemail left for the [phone] phone number.  The phone ending in [phone] is no longer in service. [RR]    Clinical Course User Index [RR] Sherrell Puller, PA-C                           Medical Decision Making Amount and/or Complexity of Data Reviewed Labs: ordered. Radiology: ordered.   42 y/o M presents to the ER for evaluation for altered mental status. Differential diagnosis includes but is not limited to drug-related, hypoxia, hyper/hypoglycemia, encephalopathy, sepsis, DKA/HHS, brain lesion, CVA, seizure, environmental, psychiatric.  Vital signs are unremarkable.  Physical exam as noted above.  This CT imaging and labs were ordered in triage.  He was also ordered Narcan.  Patient is alert and maintaining his airway and moving all extremities.  He did not have a significant reaction after the Narcan.  I independently reviewed and interpreted the patient's labs.  CK elevated at 1236, but down trending to 879 after two bags of fluid.  Urinalysis shows 30 protein otherwise no signs on infection. Normal lactic acid.  CBG within normal limits.  CBC shows no leukocytosis or anemia.  Ethanol less than 10.  CMP does show mildly decreased sodium 132, glucose at 100, bicarb of 20 however normal anion gap.  AST elevated over ALT, bili due to chronic EtOH consumption.  9.6 for elevated protein.  Patient is UDS is positive for cocaine, amphetamines, and THC.  CT head shows  1. Normal noncontrast CT appearance of the brain. 2. Widespread acute appearing paranasal sinus inflammation. Anterior nasal septal perforation which is nonspecific and can be the sequelae of trauma, granulomatous disease, toxic exposure (cocaine, chronic decongestants).   CT cervical spine shows 1. No acute traumatic injury identified in the cervical spine. 2. Chronic C5-C6 disc  degeneration. 3. Mild left apical Emphysema.  CT shows chronic cocaine use, given patient's UDS I likely think that this altered mental status is from drug misuse.  After fluid resuscitation and some time, patient is becoming more talkative.  He reports that he uses meth, cocaine, THC.  He reports he used some last night but this is likely the causation of his symptoms.  Meth use also says over the patient was itching himself all over as well.  He is ambulatory and eating and drinking.  Clinically appears sober at this time.  He has alert and oriented x 4 and is speaking appropriately and answering questions appropriately. He currently has no complaints other than wanting more food.   I discussed with him the stop using drugs.  He  denies any daily alcohol use but mentions he has 3-4 beers a few days a week. His ammonia is elevated at 57 however he is back to his baseline. I do not appreciate any asterixis. He is back to his baseline and is A&O x3. I doubt any stroke of TIA given improvement and in the setting of recent drug use.    We discussed strict return precautions and red flag symptoms.  Patient verbalized his understanding and agrees the plan.  Patient is stable and being discharged home in good condition.  I discussed this case with my attending physician who cosigned this note including patient's presenting symptoms, physical exam, and planned diagnostics and interventions. Attending physician stated agreement with plan or made changes to plan which were implemented.   Attending physician assessed patient at bedside.  Final Clinical Impression(s) / ED Diagnoses Final diagnoses:  Polysubstance use disorder  Hyponatremia  Transient alteration of awareness    Rx / DC Orders ED Discharge Orders     None         Sherrell Puller, PA-C 02/07/22 1008    Gareth Morgan, MD 02/09/22 1300

## 2022-05-16 ENCOUNTER — Emergency Department (HOSPITAL_COMMUNITY): Payer: Medicaid Other

## 2022-05-16 ENCOUNTER — Ambulatory Visit (HOSPITAL_COMMUNITY)
Admission: EM | Admit: 2022-05-16 | Discharge: 2022-05-16 | Disposition: A | Discharge status: Home / Self Care | Payer: Medicaid Other

## 2022-05-16 ENCOUNTER — Other Ambulatory Visit: Payer: Self-pay

## 2022-05-16 ENCOUNTER — Emergency Department (HOSPITAL_COMMUNITY)
Admission: EM | Admit: 2022-05-16 | Discharge: 2022-05-16 | Disposition: A | Discharge status: Home / Self Care | Payer: Medicaid Other | Attending: Emergency Medicine | Admitting: Emergency Medicine

## 2022-05-16 ENCOUNTER — Encounter (HOSPITAL_COMMUNITY): Payer: Self-pay

## 2022-05-16 DIAGNOSIS — S6991XA Unspecified injury of right wrist, hand and finger(s), initial encounter: Secondary | ICD-10-CM | POA: Diagnosis present

## 2022-05-16 DIAGNOSIS — S61011A Laceration without foreign body of right thumb without damage to nail, initial encounter: Secondary | ICD-10-CM | POA: Insufficient documentation

## 2022-05-16 DIAGNOSIS — M7989 Other specified soft tissue disorders: Secondary | ICD-10-CM | POA: Insufficient documentation

## 2022-05-16 DIAGNOSIS — S61011D Laceration without foreign body of right thumb without damage to nail, subsequent encounter: Secondary | ICD-10-CM

## 2022-05-16 DIAGNOSIS — W268XXA Contact with other sharp object(s), not elsewhere classified, initial encounter: Secondary | ICD-10-CM | POA: Diagnosis not present

## 2022-05-16 DIAGNOSIS — Z23 Encounter for immunization: Secondary | ICD-10-CM | POA: Insufficient documentation

## 2022-05-16 MED ORDER — TETANUS-DIPHTH-ACELL PERTUSSIS 5-2.5-18.5 LF-MCG/0.5 IM SUSY
0.5000 mL | PREFILLED_SYRINGE | Freq: Once | INTRAMUSCULAR | Status: AC
  Administered 2022-05-16: 0.5 mL via INTRAMUSCULAR
  Filled 2022-05-16: qty 0.5

## 2022-05-16 MED ORDER — CEPHALEXIN 500 MG PO CAPS: 500.0000 mg | ORAL_CAPSULE | Freq: Two times a day (BID) | ORAL | 0 refills | Status: AC

## 2022-05-16 NOTE — ED Triage Notes (Signed)
Pt arrives via pov. Pt reports he accidentally cut his right hand yesterday around 1200. Pt went to UC today and was told to come to the ED. Pt has a large laceration at base of right thumb. Bleeding controlled at this time.   PT also states he needs to be evaluated for STDs.

## 2022-05-16 NOTE — ED Provider Notes (Signed)
[place] EMERGENCY DEPARTMENT AT [facility] Provider Note   CSN: [id number] Arrival date & time: 05/16/22  1313     History  Chief Complaint  Patient presents with   Extremity Laceration    Rodney Nelson is a 42 y.o. male.  42 yo male with no PMH presents to the ED with a chief complaint of right hand laceration which occurred around noon yesterday.  Patient reports he went to urgent care today and was told to be evaluated in the emergency department.  He does have a large laceration over the base of the right thumb.  Decreased range of motion, unable to do a thumbs up, unable to fully flex the right thumb.  He has not taken any medication for improvement in symptoms.  He reports his last tetanus immunization is unknown.  No other injury reported.  The history is provided by the patient.       Home Medications Prior to Admission medications   Medication Sig Start Date End Date Taking? Authorizing Provider  cephALEXin (KEFLEX) 500 MG capsule Take 1 capsule (500 mg total) by mouth 2 (two) times daily for 7 days. 05/16/22 05/23/22 Yes Rahmir Beever, Leonie Douglas, PA-C  Cetirizine HCl 10 MG CAPS Take 1 capsule (10 mg total) by mouth daily for 10 days. 04/24/19 05/04/19  Wieters, Hallie C, PA-C  dextromethorphan-guaiFENesin (MUCINEX DM) 30-600 MG 12hr tablet Take 1 tablet by mouth 2 (two) times daily. 04/24/19   Wieters, Hallie C, PA-C  doxycycline (VIBRAMYCIN) 100 MG capsule Take 1 capsule (100 mg total) by mouth 2 (two) times daily. 08/29/20   Particia Nearing, PA-C  ibuprofen (ADVIL) 800 MG tablet Take 1 tablet (800 mg total) by mouth 3 (three) times daily. 04/24/19   Wieters, Hallie C, PA-C  ketoconazole (NIZORAL) 2 % cream Apply 1 application topically daily. 04/15/18   Asencion Islam, DPM  OVER THE COUNTER MEDICATION Apply 1 application topically daily as needed (rash / itching). otc back cream     [provider]      Allergies    Patient has no known allergies.    Review of  Systems   Review of Systems  Constitutional:  Negative for fever.  Skin:  Positive for wound.    Physical Exam Updated Vital Signs BP 108/70 (BP Location: Left Arm)   Pulse 75   Temp 98 F (36.7 C) (Oral)   Resp 20   Ht 5\' 7"  (1.702 m)   Wt 72.6 kg   SpO2 99%   BMI 25.06 kg/m  Physical Exam Vitals and nursing note reviewed.  Constitutional:      Appearance: Normal appearance.  HENT:     Head: Normocephalic and atraumatic.     Mouth/Throat:     Mouth: Mucous membranes are moist.  Eyes:     Pupils: Pupils are equal, round, and reactive to light.  Cardiovascular:     Rate and Rhythm: Normal rate.  Pulmonary:     Effort: Pulmonary effort is normal.  Abdominal:     General: Abdomen is flat.  Musculoskeletal:     Right hand: Tenderness present. Decreased range of motion. Decreased strength of finger abduction. Normal sensation. There is no disruption of two-point discrimination. Normal capillary refill. Normal pulse.     Cervical back: Normal range of motion and neck supple.     Comments: 2+ radial pulse, large laceration over the base of the right thumb, bleeding control and part of the wound has clotted.   Skin:  General: Skin is warm and dry.     Findings: Laceration present.     Comments: Please see photos attached.   Neurological:     Mental Status: He is alert and oriented to person, place, and time.     ED Results / Procedures / Treatments   Labs (all labs ordered are listed, but only abnormal results are displayed) Labs Reviewed - No data to display  EKG None  Radiology DG Hand Complete Right  Result Date: 05/16/2022 CLINICAL DATA:  Laceration, injury. EXAM: RIGHT HAND - COMPLETE 3+ VIEW COMPARISON:  None Available. FINDINGS: Lucency in the soft tissues overlying the thumb MCP joint likely corresponds to the reported laceration. There is no radiopaque foreign body. There is no acute fracture or dislocation. Bony alignment is normal. The joint spaces are  preserved. There is no erosive change. IMPRESSION: Laceration over the thumb MCP joint with no underlying acute osseous abnormality. Electronically Signed   By: Lesia Hausen M.D.   On: 05/16/2022 14:39    Procedures .Marland KitchenLaceration Repair  Date/Time: 05/16/2022 4:44 PM  Performed by: Claude Manges, PA-C Authorized by: Claude Manges, PA-C   Consent:    Consent obtained:  Verbal   Consent given by:  Patient   Risks discussed:  Infection, nerve damage and tendon damage Universal protocol:    Patient identity confirmed:  Verbally with patient Anesthesia:    Anesthesia method:  None Laceration details:    Location:  Finger   Finger location:  R thumb   Length (cm):  3   Depth (mm):  1 Treatment:    Area cleansed with:  Saline   Amount of cleaning:  Standard Skin repair:    Repair method:  Steri-Strips   Number of Steri-Strips:  5 Approximation:    Approximation:  Loose Repair type:    Repair type:  Simple Post-procedure details:    Dressing:  Adhesive bandage and splint for protection   Procedure completion:  Tolerated well, no immediate complications     Medications Ordered in ED Medications  Tdap (BOOSTRIX) injection 0.5 mL (0.5 mLs Intramuscular Given 05/16/22 1544)    ED Course/ Medical Decision Making/ A&P                             Medical Decision Making Amount and/or Complexity of Data Reviewed Radiology: ordered.  Risk Prescription drug management.   Patient presents to the ED status post right hand injury which occurred as he went through a glass with his right hand yesterday.  Went to urgent care to be further evaluated however sent here as he could not range his right thumb.  He has difficulty with flexion and extension of the right thumb.  Cannot do a thumbs up.  Sensation is diminished.  Some concern for tendon injury.  X-ray was obtained in triage.     Xray of the hand showed: IMPRESSION:  Laceration over the thumb MCP joint with no underlying acute  osseous  abnormality.   Wound was thoroughly irrigated while in the ED, he did have some Steri-Strips placed along with place with a thumb spica.  Tetanus was updated. I did discuss this case with Ortho Earney Hamburg on-call, and we agree on outpatient follow-up with Dr. Orlan Leavens. Patient will be placed on short course of abx to help with infection prevention as wound is >24 hours old.  Patient is hemodynamically stable for discharge.  Return precautions discussed at length.  Portions of this note were generated with Scientist, clinical (histocompatibility and immunogenetics). Dictation errors may occur despite best attempts at proofreading.   Final Clinical Impression(s) / ED Diagnoses Final diagnoses:  Laceration of right thumb without damage to nail, foreign body presence unspecified, subsequent encounter    Rx / DC Orders ED Discharge Orders          Ordered    cephALEXin (KEFLEX) 500 MG capsule  2 times daily        05/16/22 1640              Claude Manges, PA-C 05/16/22 1645    Long, Arlyss Repress, MD 05/19/22 [phone]

## 2022-05-16 NOTE — Discharge Instructions (Addendum)
You received a tetanus vaccine on today's visit.  I have prescribed antibiotics to help prevent infection, please take 1 tablet twice a day for the next 7 days.   Your right hand was placed on a thumb spica, you will need to wear this until you follow-up with orthopedics.  Please be aware if your wound shows signs of redness, drainage, worsening swelling you will need to return to the emergency department.

## 2022-05-16 NOTE — ED Triage Notes (Signed)
PT reports he cut his RT hand on window glass Tuesday in the morning. EMS came to Pt home . Pt was instructed to go to have Lac assessed . Yesterday.  Erin PA at bed side to eval Pt. Pt has limited movement to RT thumb. And lg cresent shaped lact at base of thumb.  Pt sent to Ed to be further assessment.

## 2023-09-02 ENCOUNTER — Ambulatory Visit (HOSPITAL_COMMUNITY)
Admission: EM | Admit: 2023-09-02 | Discharge: 2023-09-02 | Disposition: A | Discharge status: Home / Self Care | Payer: MEDICAID | Attending: Psychiatry | Admitting: Psychiatry

## 2023-09-02 DIAGNOSIS — F1490 Cocaine use, unspecified, uncomplicated: Secondary | ICD-10-CM | POA: Insufficient documentation

## 2023-09-02 DIAGNOSIS — F1090 Alcohol use, unspecified, uncomplicated: Secondary | ICD-10-CM | POA: Insufficient documentation

## 2023-09-02 DIAGNOSIS — Z560 Unemployment, unspecified: Secondary | ICD-10-CM | POA: Insufficient documentation

## 2023-09-02 DIAGNOSIS — F1920 Other psychoactive substance dependence, uncomplicated: Secondary | ICD-10-CM | POA: Insufficient documentation

## 2023-09-02 DIAGNOSIS — Z5189 Encounter for other specified aftercare: Secondary | ICD-10-CM

## 2023-09-02 NOTE — ED Provider Notes (Signed)
 Behavioral Health Urgent Care Medical Screening Exam  Patient Name: Rodney Nelson MRN: [id number] Date of Evaluation: 09/02/23 Chief Complaint:  I'm trying to get into the [facility] treatment program, and they told me to come here. Diagnosis:  Final diagnoses:  Polysubstance dependence (HCC)  Encounter for rehabilitation    History of Present illness:  Rodney Nelson 43 y.o., male patient presented to [facility] as a voluntary walk in unaccompanied with complaints of I'm trying to get into the [facility] treatment program, and they told me to come here. Patient reports that he initially sought rehabilitation for cocaine use disorder at [facility] but was redirected to [facility] to assess a need for medical detoxification. He expressed some confusion about this referral, which the provider clarified was to assess whether medical detoxification was necessary prior to initiating the program.   Pt reports cocaine use via insufflation that started at age 62. Reports current use of approximately 1 gram daily. Last use was on 08/31/2023.  Alcohol use around age [age], typically consuming one beer daily. Last use was also on 08/31/2023.  He denies use of any other illicit substances.  He reports he was recently discharged from incarceration on 08/28/2023. He resumed substance use shortly thereafter and last use was on 08/31/23. He denies any history of psychiatric hospitalizations.  Per chart reviewed, pt has a history of polysubstance use. He denies any known medical conditions or being prescribed any medications.  He denies history of withdrawal symptoms, seizures, delirium tremens, or blackouts.  He reports successful self-detoxification from alcohol approximately two years ago without incident or withdrawal symptoms and stated that during incarceration, he did not experience seizures or withdrawal symptoms.    Jackie Steege, is seen face to face by this provider, consulted with Dr. Cole; and chart reviewed on 09/02/23.   Rodney Nelson presented for evaluation following a referral to [facility] from [facility]. He reported that he went to [facility] seeking rehabilitation from Cocaine, but was advised to come to [facility] first to determine whether medical detoxification is necessary. He denied any current suicidal or homicidal ideation and reported no history of self-injurious behavior. He also denied having a psychiatric provider or therapist and reported no history of auditory or visual hallucinations, paranoia, or other psychotic symptoms. He stated that he receives primary care services at [facility] but was unable to recall the name of his provider. He currently lives alone and denied having access to firearms or other weapons. He is unemployed and reported being on probation for a charge related to breaking and entering.  At the time of evaluation, Rodney Nelson did not appear to be experiencing any withdrawal symptoms despite abstaining from illicit substances since 08/31/2023.  During evaluation Rodney Nelson is upright position in no acute distress. He is alert & oriented x 4, calm, cooperative and attentive for this assessment.  His mood is euthymic with congruent affect. He has normal speech, and behavior.  Objectively there is no evidence of psychosis/mania or delusional thinking. Pt does not appear to be responding to internal or external stimuli.  Patient is able to converse coherently, goal directed thoughts, no distractibility, or pre-occupation.He also denies suicidal/self-harm/homicidal ideation, psychosis, and paranoia.  Patient answered question appropriately.    Flowsheet Row ED from 09/02/2023 in [facility] ED from 05/16/2022 in [facility] Department at [facility] ED from 02/04/2022 in [facility] Emergency Department at [facility]  C-SSRS RISK CATEGORY No Risk No Risk No Risk  Psychiatric Specialty Exam  Presentation  General Appearance:Appropriate for  Environment  Eye Contact:Good  Speech:Clear and Coherent  Speech Volume:Normal  Handedness:Right   Mood and Affect  Mood: Euthymic  Affect: Congruent   Thought Process  Thought Processes: Coherent  Descriptions of Associations:Intact  Orientation:Full (Time, Place and Person)  Thought Content:WDL    Hallucinations:None  Ideas of Reference:None  Suicidal Thoughts:No  Homicidal Thoughts:No   Sensorium  Memory: Immediate Good; Remote Fair  Judgment: Fair  Insight: Good   Executive Functions  Concentration: Good  Attention Span: Good  Recall: Fair  Fund of Knowledge: Good  Language: Good   Psychomotor Activity  Psychomotor Activity: Normal   Assets  Assets: Communication Skills; Desire for Improvement; Physical Health   Sleep  Sleep: Good  Number of hours:  7   Physical Exam: Physical Exam Vitals and nursing note reviewed.  Constitutional:      Appearance: Normal appearance.  HENT:     Head: Normocephalic and atraumatic.     Nose: Nose normal.     Mouth/Throat:     Pharynx: Oropharynx is clear.  Cardiovascular:     Rate and Rhythm: Normal rate and regular rhythm.  Pulmonary:     Effort: Pulmonary effort is normal.  Musculoskeletal:        General: Normal range of motion.     Cervical back: Normal range of motion.  Neurological:     Mental Status: He is alert and oriented to person, place, and time.  Psychiatric:        Attention and Perception: Attention and perception normal.        Mood and Affect: Mood and affect normal.        Speech: Speech normal.        Behavior: Behavior normal. Behavior is cooperative.        Thought Content: Thought content normal.        Cognition and Memory: Cognition and memory normal.        Judgment: Judgment normal.    Review of Systems  Psychiatric/Behavioral:  Positive for substance abuse.   All other systems reviewed and are negative.  Blood pressure 113/74, pulse 75,  temperature 98.1 F (36.7 C), resp. rate 18, height 5' 7 (1.702 m), weight 160 lb (72.6 kg), SpO2 97%. Body mass index is 25.06 kg/m.  Musculoskeletal: Strength & Muscle Tone: within normal limits Gait & Station: normal Patient leans: N/A   [facility] MSE Discharge Disposition for Follow up and Recommendations: Based on my evaluation the patient does not appear to have an emergency medical condition and can be discharged with resources and follow up care in outpatient services for Substance Abuse Intensive Outpatient Program  Based on patient's history and the assessment completed today, he does not appear to require medical detox and can proceed to his substance rehabilitation program and at the time of evaluation, he did not appear to be experiencing any withdrawal symptoms despite abstaining from illicit substances since 08/31/2023.  Get help right away if: You have thoughts about hurting yourself or others. Get help right away if you feel like you may hurt yourself or others, or have thoughts about taking your own life. Go to your nearest emergency room or: Call 911. Call the National Suicide Prevention Lifeline at 619-492-4248 or 988 in the [place] This is open 24 hours a day. If you're a Veteran: Call 988 and press 1. This is open 24 hours a day. Text the [employer] at 614-848-2093. Summary Mental  health is not just the absence of mental illness. It involves understanding your emotions and behaviors, and taking steps to manage them in a healthy way. If you have symptoms of mental or emotional distress, get help from family, friends, a health care provider, or a mental health professional. Practice good mental health behaviors such as stress management skills, self-calming skills, exercise, healthy sleeping and eating, and supportive relationships. This information is not intended to replace advice given to you by your health care provider. Make sure you discuss any questions you have  with your health care provider.  Education provided on the fact that if experiencing worsening of psychiatry symptoms including suicidal ideations, homicidal ideations, or having auditory/visual hallucinations, etc, to call 911, 988, come back to this location, or go to the nearest ER.  Pt verbalized understanding.    Tosin Archana Eckman, NP 09/02/2023, 3:24 PM

## 2023-09-02 NOTE — Discharge Summary (Signed)
 Rodney Nelson to be discharged home per NP order. An After Visit Summary was printed and given to the patient. Patient escorted out and discharged home via private auto.  [name]  09/02/2023 3:19 PM

## 2023-09-02 NOTE — Discharge Instructions (Addendum)
 Based on your history and the assessment completed today, you do not need medical detox and can proceed to your rehabilitation program. [place] are some resources for substance rehabilitation.   Get help right away if: You have thoughts about hurting yourself or others. Get help right away if you feel like you may hurt yourself or others, or have thoughts about taking your own life. Go to your nearest emergency room or: Call 911. Call the National Suicide Prevention Lifeline at 860-630-2293 or 988 in the [place] This is open 24 hours a day. If you're a Veteran: Call 988 and press 1. This is open 24 hours a day. Text the [employer] at (670) 663-3271. Summary Mental health is not just the absence of mental illness. It involves understanding your emotions and behaviors, and taking steps to manage them in a healthy way. If you have symptoms of mental or emotional distress, get help from family, friends, a health care provider, or a mental health professional. Practice good mental health behaviors such as stress management skills, self-calming skills, exercise, healthy sleeping and eating, and supportive relationships. This information is not intended to replace advice given to you by your health care provider. Make sure you discuss any questions you have with your health care provider.  Education provided on the fact that if experiencing worsening of psychiatry symptoms including suicidal ideations, homicidal ideations, or having auditory/visual hallucinations, etc, to call 911, 988, come back to this location, or go to the nearest ER.  Pt verbalized understanding.

## 2023-09-02 NOTE — Progress Notes (Signed)
   09/02/23 1415  [facility] Triage Screening (Walk-ins at [facility] only)  How Did You Hear About Us ? Other (Comment) [facility].)  What Is the Reason for Your Visit/Call Today? Rodney Nelson is a 43 year old male presenting voluntarily to the [facility]. He was referred by the residential program [facility] [address]. The patient reports that he has been accepted into their program and was instructed to come here first for an assessment to determine if he requires services here first. The patient reports a history of cocaine use, with first use at age 89. He uses cocaine approximately once every 4-5 days, with an average amount of 1 gram per use. His last use was on Friday, August 30, 2023. He also reports some alcohol use beginning at age 79, typically consuming one beer per day. His last alcohol use was on the same day, August 30, 2023, when he drank half of a fifth of liquor. He denies experiencing any withdrawal symptoms at this time--No hx of seizures or DT's. Regarding treatment history, the patient has received outpatietn treatment for drug use but has no history of residential treatment. He denies having any mental health diagnoses. He currently denies suicidal or homicidal ideation and has no history of suicide attempts, gestures, self-injurious behaviors, or homicidal ideations. He also denies any auditory or visual hallucinations. The patient states he was recently released from 30 days of incarceration on August 28, 2023. He is currently living in a hotel room.  How Long Has This Been Causing You Problems? > than 6 months  Have You Recently Had Any Thoughts About Hurting Yourself? No  Are You Planning to Commit Suicide/Harm Yourself At This time? No  Have you Recently Had Thoughts About Hurting Someone [name]? No  Are You Planning To Harm Someone At This Time? No  Explanation: n/a  Physical Abuse Denies  Verbal Abuse Denies  Sexual Abuse Denies  Exploitation of  patient/patient's resources Denies  Self-Neglect Denies  Possible abuse reported to:  (n/a)  Are you currently experiencing any auditory, visual or other hallucinations? No  Have You Used Any Alcohol or Drugs in the Past 24 Hours? No  Do you have any current medical co-morbidities that require immediate attention? No  Clinician description of patient physical appearance/behavior: Calm and cooperative.  What Do You Feel Would Help You the Most Today? Treatment for Depression or other mood problem;Stress Management;Medication(s)  If access to [facility] was not available, would you have sought care in the Emergency Department? No  Determination of Need Urgent (48 hours)  Options For Referral Medication Management;Facility-Based Crisis;Chemical Dependency Intensive Outpatient Therapy (CDIOP);Other: Comment ([facility].)  Determination of Need filed? Yes

## 2023-09-02 NOTE — BH Assessment (Signed)
 SABRA

## 2023-09-20 ENCOUNTER — Other Ambulatory Visit: Payer: Self-pay

## 2023-09-20 ENCOUNTER — Encounter (HOSPITAL_BASED_OUTPATIENT_CLINIC_OR_DEPARTMENT_OTHER): Payer: Self-pay

## 2023-09-20 ENCOUNTER — Emergency Department (HOSPITAL_BASED_OUTPATIENT_CLINIC_OR_DEPARTMENT_OTHER)
Admission: EM | Admit: 2023-09-20 | Discharge: 2023-09-20 | Disposition: A | Discharge status: Home / Self Care | Payer: MEDICAID

## 2023-09-20 ENCOUNTER — Emergency Department (HOSPITAL_BASED_OUTPATIENT_CLINIC_OR_DEPARTMENT_OTHER): Payer: MEDICAID

## 2023-09-20 DIAGNOSIS — R079 Chest pain, unspecified: Secondary | ICD-10-CM | POA: Diagnosis present

## 2023-09-20 DIAGNOSIS — R072 Precordial pain: Secondary | ICD-10-CM | POA: Insufficient documentation

## 2023-09-20 DIAGNOSIS — F17210 Nicotine dependence, cigarettes, uncomplicated: Secondary | ICD-10-CM | POA: Diagnosis not present

## 2023-09-20 LAB — CBC
HCT: 37.6 % — ABNORMAL LOW (ref 39.0–52.0)
Hemoglobin: 12.7 g/dL — ABNORMAL LOW (ref 13.0–17.0)
MCH: 31.8 pg (ref 26.0–34.0)
MCHC: 33.8 g/dL (ref 30.0–36.0)
MCV: 94 fL (ref 80.0–100.0)
Platelets: 240 K/uL (ref 150–400)
RBC: 4 MIL/uL — ABNORMAL LOW (ref 4.22–5.81)
RDW: 14.3 % (ref 11.5–15.5)
WBC: 6.1 K/uL (ref 4.0–10.5)
nRBC: 0.3 % — ABNORMAL HIGH (ref 0.0–0.2)

## 2023-09-20 LAB — BASIC METABOLIC PANEL WITH GFR
Anion gap: 13 (ref 5–15)
BUN: 11 mg/dL (ref 6–20)
CO2: 26 mmol/L (ref 22–32)
Calcium: 8.9 mg/dL (ref 8.9–10.3)
Chloride: 101 mmol/L (ref 98–111)
Creatinine, Ser: 0.74 mg/dL (ref 0.61–1.24)
GFR, Estimated: >60 mL/min (ref >60)
Glucose, Bld: 130 mg/dL — ABNORMAL HIGH (ref 70–99)
Potassium: 3.5 mmol/L (ref 3.5–5.1)
Sodium: 139 mmol/L (ref 135–145)

## 2023-09-20 LAB — TROPONIN T, HIGH SENSITIVITY: Troponin T High Sensitivity: <15 ng/L (ref 0–19)

## 2023-09-20 NOTE — ED Notes (Signed)
 Pt medically cleared for dc. Pt dispo home.

## 2023-09-20 NOTE — ED Provider Notes (Signed)
 [facility] Provider Note   CSN: [id number] Arrival date & time: 09/20/23  1458     Patient presents with: Chest Pain   Lance Huaracha is a 43 y.o. male.   Patient with no pertinent past medical history presents today from [facility] recovery services with complaints of chest pain.  He reports symptoms occurred 2 days ago when he was at dinner eating food with hot sauce.  He reports that he got what he felt was heartburn, he was given medicine for this at [facility] and his symptoms resolved and have not returned. Reports the whole episode of symptoms lasted less than 5 minutes. Reports that today he mentioned at [facility] that he had chest pain 2 days ago and they told him he had to come to the ER for evaluation. Presents for same. Denies any recurrence of symptoms since 2 days ago.  Denies any cardiac history.  He is at [facility] for cocaine use, he has not used since then end of August.  Denies any other recreational drug use.  Does smoke about a half a pack a day of cigarettes.  Denies recent travel or surgeries, leg pain, or leg swelling.  No shortness of breath.  Reports he feels well today.  The history is provided by the patient. No language interpreter was used.  Chest Pain      Prior to Admission medications   Medication Sig Start Date End Date Taking? Authorizing Provider  Cetirizine  HCl 10 MG CAPS Take 1 capsule (10 mg total) by mouth daily for 10 days. 04/24/19 05/04/19  Wieters, Hallie C, PA-C  dextromethorphan-guaiFENesin  (MUCINEX  DM) 30-600 MG 12hr tablet Take 1 tablet by mouth 2 (two) times daily. 04/24/19   Wieters, Hallie C, PA-C  doxycycline  (VIBRAMYCIN ) 100 MG capsule Take 1 capsule (100 mg total) by mouth 2 (two) times daily. 08/29/20   Stuart Vernell Norris, PA-C  ibuprofen  (ADVIL ) 800 MG tablet Take 1 tablet (800 mg total) by mouth 3 (three) times daily. 04/24/19   Wieters, Hallie C, PA-C  ketoconazole  (NIZORAL ) 2 % cream Apply 1  application topically daily. 04/15/18   Stover, Titorya, DPM  OVER THE COUNTER MEDICATION Apply 1 application topically daily as needed (rash / itching). otc back cream     [provider]    Allergies: Patient has no known allergies.    Review of Systems  Cardiovascular:  Positive for chest pain.  All other systems reviewed and are negative.   Updated Vital Signs BP 121/71   Pulse 74   Temp 97.9 F (36.6 C)   Resp 20   Ht 5' 7 (1.702 m)   Wt 77.1 kg   SpO2 99%   BMI 26.63 kg/m   Physical Exam Vitals and nursing note reviewed.  Constitutional:      General: He is not in acute distress.    Appearance: Normal appearance. He is normal weight. He is not ill-appearing, toxic-appearing or diaphoretic.  HENT:     Head: Normocephalic and atraumatic.  Cardiovascular:     Rate and Rhythm: Normal rate and regular rhythm.     Pulses:          Dorsalis pedis pulses are 2+ on the right side and 2+ on the left side.       Posterior tibial pulses are 2+ on the right side and 2+ on the left side.     Heart sounds: Normal heart sounds.  Pulmonary:     Effort: Pulmonary  effort is normal. No respiratory distress.     Breath sounds: Normal breath sounds.  Chest:     Chest wall: No tenderness.  Abdominal:     Palpations: Abdomen is soft.     Tenderness: There is no abdominal tenderness.  Musculoskeletal:        General: Normal range of motion.     Cervical back: Normal range of motion.     Right lower leg: No tenderness. No edema.     Left lower leg: No tenderness. No edema.  Skin:    General: Skin is warm and dry.  Neurological:     General: No focal deficit present.     Mental Status: He is alert.  Psychiatric:        Mood and Affect: Mood normal.        Behavior: Behavior normal.     (all labs ordered are listed, but only abnormal results are displayed) Labs Reviewed  BASIC METABOLIC PANEL WITH GFR - Abnormal; Notable for the following components:      Result Value    Glucose, Bld 130 (*)    All other components within normal limits  CBC - Abnormal; Notable for the following components:   RBC 4.00 (*)    Hemoglobin 12.7 (*)    HCT 37.6 (*)    nRBC 0.3 (*)    All other components within normal limits  TROPONIN T, HIGH SENSITIVITY    EKG: EKG Interpretation Date/Time:  Friday September 20 2023 15:04:01 EDT Ventricular Rate:  70 PR Interval:  140 QRS Duration:  103 QT Interval:  372 QTC Calculation: 402 R Axis:   103  Text Interpretation: Sinus rhythm Probable right ventricular hypertrophy No changes compared to prior Confirmed by Simon Rea [phone]) on 09/20/2023 5:16:13 PM  Radiology: [facility] Chest 2 View Result Date: 09/20/2023 CLINICAL DATA:  Chest pain. EXAM: CHEST - 2 VIEW COMPARISON:  02/04/2022. FINDINGS: The heart size and mediastinal contours are within normal limits. No focal consolidation, pleural effusion, or pneumothorax. No acute osseous abnormality. IMPRESSION: No acute cardiopulmonary findings. Electronically Signed   By: Harrietta Sherry M.D.   On: 09/20/2023 16:03     Procedures   Medications Ordered in the ED - No data to display                                  Medical Decision Making Amount and/or Complexity of Data Reviewed Labs: ordered. Radiology: ordered.   This patient is a 43 y.o. male who presents to the ED for concern of chest pain, this involves an extensive number of treatment options, and is a complaint that carries with it a high risk of complications and morbidity. The emergent differential diagnosis prior to evaluation includes, but is not limited to,  ACS, pericarditis, myocarditis, aortic dissection, PE, pneumothorax, esophageal rupture, pneumonia, reflux/PUD, biliary disease, pancreatitis, costochondritis, anxiety  This is not an exhaustive differential.   Past Medical History / Co-morbidities / Social History:  has no past medical history on file.  Does report he is currently at [facility] recovery  services for cocaine use  Additional history: Chart reviewed.   Physical Exam: Physical exam performed. The pertinent findings include: Well-appearing, no acute physical exam abnormalities  Lab Tests: I ordered, and personally interpreted labs.  The pertinent results include: No acute laboratory abnormalities.   Imaging Studies: I ordered imaging studies including CXR. I independently visualized and interpreted imaging which  showed NAD. I agree with the radiologist interpretation.   Cardiac Monitoring:  The patient was maintained on a cardiac monitor.  My attending physician Dr. Simon viewed and interpreted the cardiac monitored which showed an underlying rhythm of: sinus rhythm, no STEMI. I agree with this interpretation.  Disposition: After consideration of the diagnostic results and the patients response to treatment, I feel that emergency department workup does not suggest an emergent condition requiring admission or immediate intervention beyond what has been performed at this time. The plan is: Discharge with close outpatient follow-up and return precautions.  Patient's workup is benign and he has been asymptomatic for 2 days.  His episode does not seem cardiac in nature given it improved with antacid medications.  Symptoms likely due to GERD which patient suspects as well.  Reports that he is only here at the request of [facility] where he is staying.  Offered antacid medication, however he reports that he is already being given this medicine at [facility] and does not need any additional medication at this time.  He has no signs or symptoms to suggest ACS, he is PERC negative.  He has a low risk heart score. He wants to be discharged to go back to [facility]. Evaluation and diagnostic testing in the emergency department does not suggest an emergent condition requiring admission or immediate intervention beyond what has been performed at this time.  Plan for discharge with close PCP follow-up.   Patient is understanding and amenable with plan, educated on red flag symptoms that would prompt immediate return.  Patient discharged in stable condition.  Final diagnoses:  Precordial chest pain    ED Discharge Orders     None     An After Visit Summary was printed and given to the patient.      Long Brimage A, PA-C 09/20/23 1735    Simon Lavonia SAILOR, MD 09/20/23 [phone]

## 2023-09-20 NOTE — ED Triage Notes (Signed)
 Arrives POV with complaints of chest pain x1 day. Patient is currently a patient of [facility] (recovering from cocaine abuse).

## 2023-09-20 NOTE — Discharge Instructions (Signed)
 You were seen in the emergency department today for chest pain.  As we discussed your lab work, EKG, chest x-ray all looked reassuring today.  I think that your symptoms could likely been related to acid reflux.  I did offer you a prescription of antacid medications, however you reported that the place you are staying has been provided you with this.  Please use these medicines as prescribed as needed.  I recommend monitoring your stress levels.  Continue to monitor how you are doing overall, and return to the emergency department for any new or worsening symptoms such as: Worsening pain or pain with exertion, difficulty breathing, sweating, or pain or swelling in your legs.

## 2023-09-30 ENCOUNTER — Emergency Department (HOSPITAL_BASED_OUTPATIENT_CLINIC_OR_DEPARTMENT_OTHER)
Admission: EM | Admit: 2023-09-30 | Discharge: 2023-09-30 | Disposition: A | Discharge status: Home / Self Care | Payer: MEDICAID | Attending: Emergency Medicine | Admitting: Emergency Medicine

## 2023-09-30 ENCOUNTER — Other Ambulatory Visit: Payer: Self-pay

## 2023-09-30 DIAGNOSIS — K0889 Other specified disorders of teeth and supporting structures: Secondary | ICD-10-CM | POA: Insufficient documentation

## 2023-09-30 DIAGNOSIS — G8929 Other chronic pain: Secondary | ICD-10-CM | POA: Diagnosis not present

## 2023-09-30 NOTE — Discharge Instructions (Signed)
  Dental Assistance If the dentist on-call cannot see you, please use the resources below:  [facility] [address] [place], [place] [address] [phone]  Patients with Medicaid: [facility] [phone] [address], [phone] [address] [phone]  If unable to pay, or uninsured, contact [facility] [phone]) or [facility] [phone] in [place], [phone] in [facility]) to become qualified for the adult dental clinic  Other Low-Cost Community Dental Services: [facility]- [address] [place], [place], [address]    [phone], Ext. [phone]    2nd and 4th Thursday of the month at 6:30am    10 clients each day by appointment, can sometimes see walk-in     patients if someone does not show for an appointment [facility]- [address] [place], [place], [address]    [phone] [facility] [address], [place], [place], [address]    [phone]  [facility] Department- [phone] [facility] Department- [phone] [facility] Department- [phone]

## 2023-09-30 NOTE — ED Notes (Signed)
 Pt alert and oriented X 4 at the time of discharge. RR even and unlabored. No acute distress noted. Pt verbalized understanding of discharge instructions as discussed. Pt ambulatory to lobby at time of discharge.

## 2023-09-30 NOTE — ED Triage Notes (Signed)
 Pt reports that the filling in his tooth fell out months ago. Denies pain/discomfort. States he needs referral to a Education officer, community.  Sent  from [facility]

## 2023-09-30 NOTE — ED Provider Notes (Signed)
   Emergency Department Provider Note   I have reviewed the triage vital signs and the nursing notes.   HISTORY  Chief Complaint Dental Problem   HPI Masami Plata is a 43 y.o. male presents to the ED with concern for intermittent dental pain requesting dental referral.  He is not actively having dental pain.  He states from time to time he will have pain in his teeth and needs to establish with a dentist.  He is hoping for a list of resources.  No sore throat or trouble breathing.  No drainage.  No dental injury.   No past medical history on file.  Review of Systems  Constitutional: No fever/chills Cardiovascular: Denies chest pain. Respiratory: Denies shortness of breath. Gastrointestinal: No abdominal pain.  No nausea, no vomiting.   Skin: Negative for rash. Neurological: Negative for headaches.  ____________________________________________   PHYSICAL EXAM:  VITAL SIGNS: ED Triage Vitals [09/30/23 1339]  Encounter Vitals Group     BP 127/76     Pulse Rate 91     Resp 14     Temp 98.3 F (36.8 C)     Temp Source Oral     SpO2 100 %   Constitutional: Alert and oriented. Well appearing and in no acute distress. Eyes: Conjunctivae are normal.  Head: Atraumatic. Nose: No congestion/rhinnorhea. Mouth/Throat: Mucous membranes are moist.  Oropharynx non-erythematous.  Diffusely poor dentition without visible abscess. Neck: No stridor.  Cardiovascular: Good peripheral circulation.  Respiratory: Normal respiratory effort.  Gastrointestinal: No distention.  Musculoskeletal: No gross deformities of extremities. Neurologic:  Normal speech and language.  Skin:  Skin is warm, dry and intact. No rash noted.  ____________________________________________   PROCEDURES  Procedure(s) performed:   Procedures  None  ____________________________________________   INITIAL IMPRESSION / ASSESSMENT AND PLAN / ED COURSE  Pertinent labs & imaging results that were available  during my care of the patient were reviewed by me and considered in my medical decision making (see chart for details).   This patient is Presenting for Evaluation of dental pain, which does require a range of treatment options, and is a complaint that involves a moderate risk of morbidity and mortality.  The Differential Diagnoses include dental carries, gingivitis, abscess, etc.  Medical Decision Making: Summary:  Patient presents emergency department with intermittent dental pain requesting referral.  No active symptoms.  No antibiotics in the setting.  Gave contact information for dental practices in the area.  Stable for discharge.   Patient's presentation is most consistent with acute, uncomplicated illness.   Disposition: discharge  ____________________________________________  FINAL CLINICAL IMPRESSION(S) / ED DIAGNOSES  Final diagnoses:  Chronic dental pain    Note:  This document was prepared using Dragon voice recognition software and may include unintentional dictation errors.  Fonda Law, MD, [facility] Emergency [facility]    Elizibeth Breau, Fonda MATSU, MD 10/01/23 [phone]

## 2024-01-07 ENCOUNTER — Other Ambulatory Visit (HOSPITAL_COMMUNITY)
Admission: EM | Admit: 2024-01-07 | Discharge: 2024-01-09 | Disposition: A | Discharge status: Rehabilitation Facility | Payer: MEDICAID | Source: Intra-hospital | Attending: Psychiatry | Admitting: Psychiatry

## 2024-01-07 ENCOUNTER — Other Ambulatory Visit (HOSPITAL_COMMUNITY)
Admission: EM | Admit: 2024-01-07 | Discharge: 2024-01-07 | Disposition: A | Discharge status: Skilled Nursing Facility | Payer: MEDICAID | Attending: Psychiatry | Admitting: Psychiatry

## 2024-01-07 DIAGNOSIS — F32A Depression, unspecified: Secondary | ICD-10-CM | POA: Insufficient documentation

## 2024-01-07 DIAGNOSIS — F1920 Other psychoactive substance dependence, uncomplicated: Secondary | ICD-10-CM | POA: Insufficient documentation

## 2024-01-07 DIAGNOSIS — Z79899 Other long term (current) drug therapy: Secondary | ICD-10-CM | POA: Insufficient documentation

## 2024-01-07 DIAGNOSIS — F1210 Cannabis abuse, uncomplicated: Secondary | ICD-10-CM | POA: Diagnosis not present

## 2024-01-07 DIAGNOSIS — F17210 Nicotine dependence, cigarettes, uncomplicated: Secondary | ICD-10-CM | POA: Insufficient documentation

## 2024-01-07 DIAGNOSIS — F1914 Other psychoactive substance abuse with psychoactive substance-induced mood disorder: Secondary | ICD-10-CM | POA: Diagnosis not present

## 2024-01-07 DIAGNOSIS — F1010 Alcohol abuse, uncomplicated: Secondary | ICD-10-CM | POA: Diagnosis not present

## 2024-01-07 DIAGNOSIS — Z653 Problems related to other legal circumstances: Secondary | ICD-10-CM | POA: Diagnosis not present

## 2024-01-07 DIAGNOSIS — I498 Other specified cardiac arrhythmias: Secondary | ICD-10-CM | POA: Insufficient documentation

## 2024-01-07 DIAGNOSIS — F1020 Alcohol dependence, uncomplicated: Secondary | ICD-10-CM | POA: Diagnosis not present

## 2024-01-07 DIAGNOSIS — Z560 Unemployment, unspecified: Secondary | ICD-10-CM | POA: Insufficient documentation

## 2024-01-07 DIAGNOSIS — Z5900 Homelessness unspecified: Secondary | ICD-10-CM | POA: Diagnosis not present

## 2024-01-07 DIAGNOSIS — F1910 Other psychoactive substance abuse, uncomplicated: Secondary | ICD-10-CM | POA: Diagnosis present

## 2024-01-07 DIAGNOSIS — F1220 Cannabis dependence, uncomplicated: Secondary | ICD-10-CM | POA: Diagnosis not present

## 2024-01-07 DIAGNOSIS — F909 Attention-deficit hyperactivity disorder, unspecified type: Secondary | ICD-10-CM | POA: Insufficient documentation

## 2024-01-07 DIAGNOSIS — F1410 Cocaine abuse, uncomplicated: Secondary | ICD-10-CM | POA: Insufficient documentation

## 2024-01-07 DIAGNOSIS — F1420 Cocaine dependence, uncomplicated: Secondary | ICD-10-CM | POA: Insufficient documentation

## 2024-01-07 DIAGNOSIS — F1994 Other psychoactive substance use, unspecified with psychoactive substance-induced mood disorder: Secondary | ICD-10-CM

## 2024-01-07 LAB — CBC WITH DIFFERENTIAL/PLATELET
Abs Immature Granulocytes: 0.02 K/uL (ref 0.00–0.07)
Basophils Absolute: 0 K/uL (ref 0.0–0.1)
Basophils Relative: 0 %
Eosinophils Absolute: 0.2 K/uL (ref 0.0–0.5)
Eosinophils Relative: 3 %
HCT: 42.6 % (ref 39.0–52.0)
Hemoglobin: 14.4 g/dL (ref 13.0–17.0)
Immature Granulocytes: 0 %
Lymphocytes Relative: 40 %
Lymphs Abs: 2.6 K/uL (ref 0.7–4.0)
MCH: 32 pg (ref 26.0–34.0)
MCHC: 33.8 g/dL (ref 30.0–36.0)
MCV: 94.7 fL (ref 80.0–100.0)
Monocytes Absolute: 0.7 K/uL (ref 0.1–1.0)
Monocytes Relative: 11 %
Neutro Abs: 3 K/uL (ref 1.7–7.7)
Neutrophils Relative %: 46 %
Platelets: 206 K/uL (ref 150–400)
RBC: 4.5 MIL/uL (ref 4.22–5.81)
RDW: 13.5 % (ref 11.5–15.5)
WBC: 6.6 K/uL (ref 4.0–10.5)
nRBC: 0 % (ref 0.0–0.2)

## 2024-01-07 LAB — LIPID PANEL
Cholesterol: 230 mg/dL — ABNORMAL HIGH (ref 0–200)
HDL: 54 mg/dL
LDL Cholesterol: 120 mg/dL — ABNORMAL HIGH (ref 0–99)
Total CHOL/HDL Ratio: 4.3 ratio
Triglycerides: 279 mg/dL — ABNORMAL HIGH
VLDL: 56 mg/dL — ABNORMAL HIGH (ref 0–40)

## 2024-01-07 LAB — COMPREHENSIVE METABOLIC PANEL WITH GFR
ALT: 49 U/L — ABNORMAL HIGH (ref 0–44)
AST: 49 U/L — ABNORMAL HIGH (ref 15–41)
Albumin: 4.3 g/dL (ref 3.5–5.0)
Alkaline Phosphatase: 102 U/L (ref 38–126)
Anion gap: 11 (ref 5–15)
BUN: 18 mg/dL (ref 6–20)
CO2: 25 mmol/L (ref 22–32)
Calcium: 9.4 mg/dL (ref 8.9–10.3)
Chloride: 102 mmol/L (ref 98–111)
Creatinine, Ser: 0.99 mg/dL (ref 0.61–1.24)
GFR, Estimated: >60 mL/min
Glucose, Bld: 115 mg/dL — ABNORMAL HIGH (ref 70–99)
Potassium: 4.2 mmol/L (ref 3.5–5.1)
Sodium: 138 mmol/L (ref 135–145)
Total Bilirubin: 0.4 mg/dL (ref 0.0–1.2)
Total Protein: 7.4 g/dL (ref 6.5–8.1)

## 2024-01-07 LAB — POCT URINE DRUG SCREEN - MANUAL ENTRY (I-SCREEN)
POC Amphetamine UR: NOT DETECTED
POC Buprenorphine (BUP): NOT DETECTED
POC Cocaine UR: POSITIVE — AB
POC Marijuana UR: POSITIVE — AB
POC Methadone UR: NOT DETECTED
POC Methamphetamine UR: NOT DETECTED
POC Morphine: NOT DETECTED
POC Oxazepam (BZO): NOT DETECTED
POC Oxycodone UR: NOT DETECTED
POC Secobarbital (BAR): NOT DETECTED

## 2024-01-07 LAB — HEMOGLOBIN A1C
Hgb A1c MFr Bld: 5.6 % (ref 4.8–5.6)
Mean Plasma Glucose: 114.02 mg/dL

## 2024-01-07 LAB — TSH: TSH: 0.377 u[IU]/mL (ref 0.350–4.500)

## 2024-01-07 LAB — ETHANOL: Alcohol, Ethyl (B): <15 mg/dL

## 2024-01-07 LAB — MAGNESIUM: Magnesium: 1.9 mg/dL (ref 1.7–2.4)

## 2024-01-07 MED ORDER — HALOPERIDOL LACTATE 5 MG/ML IJ SOLN: 5.0000 mg | Freq: Three times a day (TID) | INTRAMUSCULAR | Status: DC | PRN

## 2024-01-07 MED ORDER — DIPHENHYDRAMINE HCL 50 MG/ML IJ SOLN: 50.0000 mg | Freq: Three times a day (TID) | INTRAMUSCULAR | Status: DC | PRN

## 2024-01-07 MED ORDER — LORAZEPAM 2 MG/ML IJ SOLN: 2.0000 mg | Freq: Three times a day (TID) | INTRAMUSCULAR | Status: DC | PRN

## 2024-01-07 MED ORDER — HALOPERIDOL LACTATE 5 MG/ML IJ SOLN: 10.0000 mg | Freq: Three times a day (TID) | INTRAMUSCULAR | Status: DC | PRN

## 2024-01-07 MED ORDER — HALOPERIDOL 5 MG PO TABS: 5.0000 mg | ORAL_TABLET | Freq: Three times a day (TID) | ORAL | Status: DC | PRN

## 2024-01-07 MED ORDER — ALUM & MAG HYDROXIDE-SIMETH 200-200-20 MG/5ML PO SUSP: 30.0000 mL | ORAL | Status: DC | PRN

## 2024-01-07 MED ORDER — DIPHENHYDRAMINE HCL 50 MG PO CAPS: 50.0000 mg | ORAL_CAPSULE | Freq: Three times a day (TID) | ORAL | Status: DC | PRN

## 2024-01-07 MED ORDER — ACETAMINOPHEN 325 MG PO TABS: 650.0000 mg | ORAL_TABLET | Freq: Four times a day (QID) | ORAL | Status: DC | PRN

## 2024-01-07 MED ORDER — MAGNESIUM HYDROXIDE 400 MG/5ML PO SUSP: 30.0000 mL | Freq: Every day | ORAL | Status: DC | PRN

## 2024-01-07 NOTE — ED Provider Notes (Signed)
 Facility Based Crisis Admission H&P  Date: 01/07/2024 Patient Name: Rodney Nelson MRN: [id number] Chief Complaint: I want to detox  Diagnoses:  Final diagnoses:  Polysubstance dependence [facility])  Attention deficit hyperactivity disorder (ADHD), unspecified ADHD type    HPI: Patient presents to the [facility] voluntarily as a walk-in unaccompanied, with a chief complaint of I want to detox.   Patient reports he is addicted to cocaine, Alcohol and Marijuana. States he has been using  these substances on a daily basis since age 43.  He also reports a hx of ADHD with no current medications. No current outpatient services.  He reports that he consumes a 5th (liquor) every day. Last use was yesterday.  Patient reports he was recently discharged from [facility] residential treatment, then relapsed after one week.  Reports that his mother kicked him out of the house due to substance abuse and talking nasty. He is currently homeless. Patient reports he also is on probation due to drug possession. Has a court date on 01/27/2024.  He reports that he wants to detox and try to get back into [facility] program. He denies SI/HI/AVH.   Patient is evaluated face-to-face by this clinician. Chart is reviewed and Dr Cole is consulted.  On approach, patient  is alert and oriented x 4. His thought process is coherent and goal-directed. He maintains eye contact throughout this assessment. Patient answers questions appropriately. His  thought process is coherent and logical, speech is clear and well articulated,  and tone is normal. Patient is casually  dressed and somewhat disheveled, unkept.  Patient denies suicidal thoughts. Denies  self harm behaviors. Patient denies homicidal ideations. He denies AVH and does not appear to be responding to internal stimuli. He reports that his goal is to complete medical detox and head back to [facility] if accepted.  He denies feeling  depressed and does not appear to be in any acute distress. Reports that he does not have any outpatient services. Currently unemployed and homeless. Was living with his mother but was kicked out due to unacceptable behaviors. He reports that he sleeps well and his appetite is good.     Per chart review:  Patient was evaluated here on 09/02/2023 when he presented with the same chief complaints. He reported to the examining provider that he was recommended by [facility] to come here for assessment and detox before they accept him in their program.  Upon assessment completion, it was determined that he did not need  detox and did not exhibit any withdrawal symptoms despite abstaining from drugs and alcohol for 2 days.   Patient presents with motivation for treatment and rehab. He reports that he is an active consumer of alcohol  and wants to complete medical detox and continue with rehab services.  After discussing this case with Dr Cole, admission to [facility] is recommended. We will initiate CIWA  per facility protocol and will treat the symptoms accordingly. We will coordinate with case management for disposition.      PHQ 2-9:   Flowsheet Row ED from 01/07/2024 in [facility] ED from 09/30/2023 in [facility] Emergency Department at [facility] ED from 09/20/2023 in [facility] Emergency Department at [facility]  C-SSRS RISK CATEGORY No Risk No Risk No Risk      Total Time spent with patient: 45 minutes  Musculoskeletal  Strength & Muscle Tone: within normal limits Gait & Station: normal Patient leans: N/A  Psychiatric  Specialty Exam  Presentation General Appearance:  Casual; Disheveled  Eye Contact: Fair  Speech: Clear and Coherent  Speech Volume: Normal  Handedness: Right   Mood and Affect  Mood: Labile  Affect: Congruent   Thought Process  Thought Processes: Coherent  Descriptions of  Associations:Intact  Orientation:Full (Time, Place and Person)  Thought Content:WDL    Hallucinations:Hallucinations: None  Ideas of Reference:None  Suicidal Thoughts:Suicidal Thoughts: No  Homicidal Thoughts:Homicidal Thoughts: No   Sensorium  Memory: Immediate Fair; Recent Fair; Remote Fair  Judgment: Fair  Insight: Fair   Art Therapist  Concentration: Fair  Attention Span: Fair  Recall: [employer] of Knowledge: Fair  Language: Fair   Psychomotor Activity  Psychomotor Activity: Psychomotor Activity: Restlessness   Assets  Assets: Communication Skills; Desire for Improvement   Sleep  Sleep: Sleep: Fair Number of Hours of Sleep: 5   Nutritional Assessment (For OBS and FBC admissions only) Has the patient had a weight loss or gain of 10 pounds or more in the last 3 months?: No Has the patient had a decrease in food intake/or appetite?: No Does the patient have dental problems?: No Does the patient have eating habits or behaviors that may be indicators of an eating disorder including binging or inducing vomiting?: No Has the patient recently lost weight without trying?: 0 Has the patient been eating poorly because of a decreased appetite?: 0 Malnutrition Screening Tool Score: 0    Physical Exam Vitals and nursing note reviewed.  HENT:     Head: Normocephalic and atraumatic.     Right Ear: Tympanic membrane normal.     Left Ear: Tympanic membrane normal.     Nose: Nose normal.  Eyes:     Extraocular Movements: Extraocular movements intact.     Pupils: Pupils are equal, round, and reactive to light.  Cardiovascular:     Rate and Rhythm: Normal rate.     Pulses: Normal pulses.  Pulmonary:     Effort: Pulmonary effort is normal.  Musculoskeletal:        General: Normal range of motion.     Cervical back: Normal range of motion and neck supple.  Neurological:     General: No focal deficit present.     Mental Status: He is alert  and oriented to person, place, and time.  Psychiatric:        Thought Content: Thought content normal.    Review of Systems  Constitutional: Negative.   HENT: Negative.    Eyes: Negative.   Respiratory: Negative.    Cardiovascular: Negative.   Gastrointestinal: Negative.   Genitourinary: Negative.   Musculoskeletal: Negative.   Skin: Negative.   Neurological: Negative.   Endo/Heme/Allergies: Negative.   Psychiatric/Behavioral:  Positive for substance abuse.     Blood pressure 121/77, pulse 74, temperature 98.7 F (37.1 C), temperature source Oral, resp. rate 17, SpO2 98%. There is no height or weight on file to calculate BMI.  Past Psychiatric History: ADHD, Polysubstance abuse   Is the patient at risk to self? No  Has the patient been a risk to self in the past 6 months? No .    Has the patient been a risk to self within the distant past? No   Is the patient a risk to others? No   Has the patient been a risk to others in the past 6 months? No   Has the patient been a risk to others within the distant past? No   Past Medical History: NA  Family History: NA Social History: NA  Last Labs:  Admission on 09/20/2023, Discharged on 09/20/2023  Component Date Value Ref Range Status   Sodium 09/20/2023 139  135 - 145 mmol/L Final   Potassium 09/20/2023 3.5  3.5 - 5.1 mmol/L Final   Chloride 09/20/2023 101  98 - 111 mmol/L Final   CO2 09/20/2023 26  22 - 32 mmol/L Final   Glucose, Bld 09/20/2023 130 (H)  70 - 99 mg/dL Final   Glucose reference range applies only to samples taken after fasting for at least 8 hours.   BUN 09/20/2023 11  6 - 20 mg/dL Final   Creatinine, Ser 09/20/2023 0.74  0.61 - 1.24 mg/dL Final   Calcium 90/87/7974 8.9  8.9 - 10.3 mg/dL Final   GFR, Estimated 09/20/2023 >60  >60 mL/min Final   Comment: (NOTE) Calculated using the CKD-EPI Creatinine Equation (2021)    Anion gap 09/20/2023 13  5 - 15 Final   Performed at [facility], [address] [place], [place] [address]   WBC 09/20/2023 6.1  4.0 - 10.5 K/uL Final   REPEATED TO VERIFY   RBC 09/20/2023 4.00 (L)  4.22 - 5.81 MIL/uL Final   Hemoglobin 09/20/2023 12.7 (L)  13.0 - 17.0 g/dL Final   HCT 90/87/7974 37.6 (L)  39.0 - 52.0 % Final   MCV 09/20/2023 94.0  80.0 - 100.0 fL Final   MCH 09/20/2023 31.8  26.0 - 34.0 pg Final   MCHC 09/20/2023 33.8  30.0 - 36.0 g/dL Final   RDW 90/87/7974 14.3  11.5 - 15.5 % Final   Platelets 09/20/2023 240  150 - 400 K/uL Final   nRBC 09/20/2023 0.3 (H)  0.0 - 0.2 % Final   Performed at [facility], [address] [place], [place] [address]   Troponin T High Sensitivity 09/20/2023 <15  0 - 19 ng/L Final   Comment: (NOTE) Biotin concentrations > 1000 ng/mL falsely decrease TnT results.  Serial cardiac troponin measurements are suggested.  Refer to the Links section for chest pain algorithms and additional  guidance. Performed at [facility], [address] [place], [place] [address]     Allergies: Patient has no known allergies.  Medications:  Facility Ordered Medications  Medication   acetaminophen  (TYLENOL ) tablet 650 mg   alum & mag hydroxide-simeth (MAALOX/MYLANTA) 200-200-20 MG/5ML suspension 30 mL   magnesium hydroxide (MILK OF MAGNESIA) suspension 30 mL   haloperidol (HALDOL) tablet 5 mg   And   diphenhydrAMINE (BENADRYL) capsule 50 mg   haloperidol lactate (HALDOL) injection 5 mg   And   diphenhydrAMINE (BENADRYL) injection 50 mg   And   LORazepam  (ATIVAN ) injection 2 mg   haloperidol lactate (HALDOL) injection 10 mg   And   diphenhydrAMINE (BENADRYL) injection 50 mg   And   LORazepam  (ATIVAN ) injection 2 mg    Long Term Goals: Improvement in symptoms so as ready for discharge  Short Term Goals: Patient will verbalize feelings in meetings with treatment team members., Patient will attend at least of 50% of the groups daily., Pt will complete the PHQ9 on admission, day 3 and discharge.,  Patient will participate in completing the [place] Suicide Severity Rating Scale, Patient will score a low risk of violence for 24 hours prior to discharge, and Patient will take medications as prescribed daily.  Medical Decision Making  Admission to [facility]. CIWA protocol initiated Agitation protocol EKG Acetaminophen  650 mg PO  Q 6 PRN for mild to moderate pain Maalox 30 mg PO Q 4 PRN for indigestion Milk of Magnesia 30 ml PO Daily PRN for mild constipation  Labs: CBC, CMP, TSH, A1C, UDS, Ethanol, Magnesium, Hepatic function panel, Lipid panel    Recommendations  Based on my evaluation the patient does not appear to have an emergency medical condition.  Randall Bouquet, NP 01/07/2024  2:12 PM

## 2024-01-07 NOTE — ED Notes (Signed)
Patient sleeping with no s/s of distress.

## 2024-01-07 NOTE — Group Note (Unsigned)
 Group Topic: Overcoming Obstacles  Group Date: 01/07/2024 Start Time: 2000 End Time: 2100 Facilitators: Carollynn Genre, NT  Department: [employer]  Number of Participants: 4  Group Focus: daily focus Treatment Modality:  Patient-Centered Therapy Interventions utilized were problem solving Purpose: regain self-worth   Name: Helton Oleson Date of Birth: 03/10/1980  MR: [id number]    Level of Participation: {THERAPIES; PSYCH GROUP PARTICIPATION OZCZO:76008} Quality of Participation: {THERAPIES; PSYCH QUALITY OF PARTICIPATION:23992} Interactions with others: {THERAPIES; PSYCH INTERACTIONS:23993} Mood/Affect: {THERAPIES; PSYCH MOOD/AFFECT:23994} Triggers (if applicable): *** Cognition: {THERAPIES; PSYCH COGNITION:23995} Progress: {THERAPIES; PSYCH PROGRESS:23997} Response: *** Plan: {THERAPIES; PSYCH EOJW:76003}  Patients Problems:  Patient Active Problem List   Diagnosis Date Noted   Polysubstance abuse (HCC) 01/07/2024

## 2024-01-07 NOTE — Group Note (Unsigned)
 Group Topic: Fears and Unhealthy Coping Skills  Group Date: 01/07/2024 Start Time: 2000 End Time: 2100 Facilitators: Carollynn Genre, NT  Department: [facility]  Number of Participants: 4  Group Focus: feeling awareness/expression Treatment Modality:  Skills Training Interventions utilized were patient education Purpose: regain self-worth   Name: Rodney Nelson Date of Birth: 1980-02-06  MR: [id number]    Level of Participation: {THERAPIES; PSYCH GROUP PARTICIPATION OZCZO:76008} Quality of Participation: {THERAPIES; PSYCH QUALITY OF PARTICIPATION:23992} Interactions with others: {THERAPIES; PSYCH INTERACTIONS:23993} Mood/Affect: {THERAPIES; PSYCH MOOD/AFFECT:23994} Triggers (if applicable): *** Cognition: {THERAPIES; PSYCH COGNITION:23995} Progress: {THERAPIES; PSYCH PROGRESS:23997} Response: *** Plan: {THERAPIES; PSYCH EOJW:76003}  Patients Problems:  Patient Active Problem List   Diagnosis Date Noted   Polysubstance abuse (HCC) 01/07/2024

## 2024-01-07 NOTE — BH Assessment (Addendum)
 Comprehensive Clinical Assessment (CCA) Note  01/07/2024 Rodney Nelson [id number]  DISPOSITION: Per Starlyn Patron NP pt is recommended for detox in the [facility] at [facility]  The patient demonstrates the following risk factors for suicide: Chronic risk factors for suicide include: substance use disorder. Acute risk factors for suicide include: unemployment and social withdrawal/isolation. Protective factors for this patient include: hope for the future. Considering these factors, the overall suicide risk at this point appears to be low. Patient is appropriate for outpatient follow up.   Per Triage assessment: Rodney Nelson 43y male presents to [facility] unaccompanied. PT states he is here so he can go to [facility]. PT states he is here to detox off of drugs and alcohol. PT states he uses every other day and the amount is a lot. PT denies SI, HI, and AVH.  Pt is a 43 yo male who presented voluntarily and unaccompanied requesting detox from multiple substances. Pt stated that he uses alcohol, cocaine and cannabis every other day and a lot of it. Pt did not report any current withdrawal symptoms. Pt denied Si, HI, NSSH, AVH and paranoia. Pt stated that he is homeless and is unemployed. Pt stated that he is currently on probation and has an upcoming court date in January 2026. Pt stated he has never married and has no children. Pt stated he stopped school in the 9th grade.   Pt was calm, cooperative, alert and seemed fully oriented. Pt was not responding to internal stimuli and did not seem delusional. Pt was dressed casually and seemed inadequately groomed. Pt's mood seemed euthymic and his affect seemed appropriately responsive. Pt's judgment seemed poor and his insight seemed fair.     Chief Complaint:  Chief Complaint  Patient presents with   Addiction Problem   Visit Diagnosis:  Alcohol Use d/o Stimulant Use d/o, Cocaine Cannabis Use d/o    CCA Screening, Triage and Referral (STR)  Patient  Reported Information How did you hear about us ? Other (Comment) [facility].)  What Is the Reason for Your Visit/Call Today? Rodney Nelson 43y male presents to [facility] unaccompanied. PT states he is here so he can go to [facility]. PT states he is here to detox off of drugs and alcohol. PT states he uses every other day and the amount is a lot. PT denies SI, HI, and AVH.  How Long Has This Been Causing You Problems? > than 6 months  What Do You Feel Would Help You the Most Today? Alcohol or Drug Use Treatment   Have You Recently Had Any Thoughts About Hurting Yourself? No  Are You Planning to Commit Suicide/Harm Yourself At This time? No   Flowsheet Row ED from 01/07/2024 in [facility] ED from 09/30/2023 in [facility] Emergency Department at [facility] ED from 09/20/2023 in [facility] Emergency Department at [facility]  C-SSRS RISK CATEGORY No Risk No Risk No Risk    Have you Recently Had Thoughts About Hurting Someone [name]? No  Are You Planning to Harm Someone at This Time? No  Explanation: n/a   Have You Used Any Alcohol or Drugs in the Past 24 Hours? Yes  How Long Ago Did You Use Drugs or Alcohol? yesterday What Did You Use and How Much? cocaine (1g), alcohol (fifth of liquor), marijuana (an eighth)   Do You Currently Have a Therapist/Psychiatrist? No  Name of Therapist/Psychiatrist: na   Have You Been Recently Discharged From Any Office Practice or Programs? No  Explanation of Discharge From Practice/Program: na    CCA Screening Triage Referral Assessment Type of Contact: Face-to-Face  Telemedicine Service Delivery:   Is this Initial or Reassessment?   Date Telepsych consult ordered in [facility]:    Time Telepsych consult ordered in [facility]:    Location of Assessment: [facility] Assessment Services  Provider Location: [facility] Assessment Services   Collateral Involvement: none   Does Patient Have a Production Manager Guardian? No  Legal Guardian Contact Information: na  Copy of Legal Guardianship Form: -- (na)  Legal Guardian Notified of Arrival: -- (na)  Legal Guardian Notified of Pending Discharge: -- (na)  If Minor and Not Living with Parent(s), Who has Custody? adult  Is CPS involved or ever been involved? Never  Is APS involved or ever been involved? Never   Patient Determined To Be At Risk for Harm To Self or Others Based on Review of Patient Reported Information or Presenting Complaint? No  Method: No Plan  Availability of Means: No access or NA  Intent: Vague intent or NA  Notification Required: No need or identified person  Additional Information for Danger to Others Potential: -- (na)  Additional Comments for Danger to Others Potential: na  Are There Guns or Other Weapons in Your Home? No  Types of Guns/Weapons: na  Are These Weapons Safely Secured?                            -- (na)  Who Could Verify You Are Able To Have These Secured: na  Do You Have any Outstanding Charges, Pending Court Dates, Parole/Probation? denied  Contacted To Inform of Risk of Harm To Self or Others: -- (na)    Does Patient Present under Involuntary Commitment? No    [place] of Residence: [place]   Patient Currently Receiving the Following Services: Not Receiving Services   Determination of Need: Routine (7 days)   Options For Referral: Facility-Based Crisis; Outpatient Therapy     CCA Biopsychosocial Patient Reported Schizophrenia/Schizoaffective Diagnosis in Past: No   Strengths: able to ask for and accept help   Mental Health Symptoms Depression:  None   Duration of Depressive symptoms:    Mania:  None   Anxiety:   None   Psychosis:  None   Duration of Psychotic symptoms:    Trauma:  None   Obsessions:  None   Compulsions:  None   Inattention:  N/A   Hyperactivity/Impulsivity:  N/A   Oppositional/Defiant Behaviors:  N/A   Emotional  Irregularity:  None   Other Mood/Personality Symptoms:  na    Mental Status Exam Appearance and self-care  Stature:  Average   Weight:  Average weight   Clothing:  Casual; Disheveled (inappropriately dressed for the weather today)   Grooming:  Neglected   Cosmetic use:  None   Posture/gait:  Normal   Motor activity:  Not Remarkable   Sensorium  Attention:  Normal   Concentration:  Normal   Orientation:  X5   Recall/memory:  Normal   Affect and Mood  Affect:  Full Range   Mood:  Euthymic   Relating  Eye contact:  Normal   Facial expression:  Responsive   Attitude toward examiner:  Cooperative   Thought and Language  Speech flow: Clear and Coherent   Thought content:  Appropriate to Mood and Circumstances   Preoccupation:  None   Hallucinations:  None   Organization:  Coherent; Linear  Company Secretary of Knowledge:  Average   Intelligence:  Average   Abstraction:  Functional   Judgement:  Poor   Reality Testing:  Adequate   Insight:  Fair   Decision Making:  Impulsive   Social Functioning  Social Maturity:  Impulsive   Social Judgement:  Chief Of Staff   Stress  Stressors:  -- (None disclosed besides substance use)   Coping Ability:  Deficient supports   Skill Deficits:  Decision making; Responsibility; Self-care; Self-control   Supports:  Support needed     Religion: Religion/Spirituality Are You A Religious Person?: Yes What is Your Religious Affiliation?: Christian How Might This Affect Treatment?: unknown  Leisure/Recreation: Leisure / Recreation Do You Have Hobbies?: No  Exercise/Diet: Exercise/Diet Do You Exercise?: Yes What Type of Exercise Do You Do?: Run/Walk How Many Times a Week Do You Exercise?: 4-5 times a week Have You Gained or Lost A Significant Amount of Weight in the Past Six Months?: No Do You Follow a Special Diet?: No Do You Have Any Trouble Sleeping?: No   CCA  Employment/Education Employment/Work Situation: Employment / Work Situation Employment Situation: Unemployed Patient's Job has Been Impacted by Current Illness:  (na) Has Patient ever Been in the [employer]?: No  Education: Education Is Patient Currently Attending School?: No Last Grade Completed: 8 Did You Product Manager?: No Did You Have An Individualized Education Program (IIEP): No Patient's Education Has Been Impacted by Current Illness: No   CCA Family/Childhood History Family and Relationship History: Family history Marital status: Single Does patient have children?: No  Childhood History:  Childhood History By whom was/is the patient raised?: Both parents Did patient suffer any verbal/emotional/physical/sexual abuse as a child?: No Did patient suffer from severe childhood neglect?: No Has patient ever been sexually abused/assaulted/raped as an adolescent or adult?: No Was the patient ever a victim of a crime or a disaster?: No Witnessed domestic violence?: No Has patient been affected by domestic violence as an adult?: No       CCA Substance Use Alcohol/Drug Use: Alcohol / Drug Use Pain Medications: see MAR Prescriptions: see MAR Over the Counter: see MAR History of alcohol / drug use?: Yes Longest period of sobriety (when/how long): unknown Negative Consequences of Use:  (none reported) Withdrawal Symptoms:  (none reported) Substance #1 Name of Substance 1: alcohol 1 - Age of First Use: teen 1 - Amount (size/oz): a fifth 1 - Frequency: every other day 1 - Duration: ongoing 1 - Last Use / Amount: yesterday 1 - Method of Aquiring: unknown 1- Route of Use: oral Substance #2 Name of Substance 2: cocaine 2 - Age of First Use: teen 2 - Amount (size/oz): 1 gm 2 - Frequency: every other day 2 - Duration: ongoing 2 - Last Use / Amount: yesterday 2 - Method of Aquiring: unknown 2 - Route of Substance Use: snort Substance #3 Name of Substance 3:  cannabis 3 - Age of First Use: teen 3 - Amount (size/oz): 1/8 gm 3 - Frequency: every other day 3 - Duration: ongoing 3 - Last Use / Amount: yesterday 3 - Method of Aquiring: unknown 3 - Route of Substance Use: smoke                   ASAM's:  Six Dimensions of Multidimensional Assessment  Dimension 1:  Acute Intoxication and/or Withdrawal Potential:   Dimension 1:  Description of individual's past and current experiences of substance use and withdrawal: none reported  Dimension 2:  Biomedical Conditions and Complications:   Dimension 2:  Description of patient's biomedical conditions and  complications: none reported  Dimension 3:  Emotional, Behavioral, or Cognitive Conditions and Complications:  Dimension 3:  Description of emotional, behavioral, or cognitive conditions and complications: No significant hx reported  Dimension 4:  Readiness to Change:     Dimension 5:  Relapse, Continued use, or Continued Problem Potential:     Dimension 6:  Recovery/Living Environment:     ASAM Severity Score: ASAM's Severity Rating Score: 6  ASAM Recommended Level of Treatment:     Substance use Disorder (SUD) Substance Use Disorder (SUD)  Checklist Symptoms of Substance Use: Continued use despite having a persistent/recurrent physical/psychological problem caused/exacerbated by use, Continued use despite persistent or recurrent social, interpersonal problems, caused or exacerbated by use, Persistent desire or unsuccessful efforts to cut down or control use, Recurrent use that results in a failure to fulfill major role obligations (work, school, home)  Recommendations for Services/Supports/Treatments: Recommendations for Services/Supports/Treatments Recommendations For Services/Supports/Treatments: Detox  Disposition Recommendation per psychiatric provider: We recommend transfer to [facility]. Per Starlyn Patron NP pt is recommended for detox in the [facility]  at [facility]   DSM5 Diagnoses: Patient Active Problem List   Diagnosis Date Noted   Polysubstance abuse (HCC) 01/07/2024     Referrals to Alternative Service(s): Referred to Alternative Service(s):   Place:   Date:   Time:    Referred to Alternative Service(s):   Place:   Date:   Time:    Referred to Alternative Service(s):   Place:   Date:   Time:    Referred to Alternative Service(s):   Place:   Date:   Time:     Shannon Kirkendall T, Counselor

## 2024-01-07 NOTE — Group Note (Signed)
 Group Topic: Change and Accountability  Group Date: 01/07/2024 Start Time: 2000 End Time: 2100 Facilitators: Carollynn Genre, NT  Department: [facility]  Number of Participants: 4  Group Focus: acceptance Treatment Modality:  Patient-Centered Therapy Interventions utilized were group exercise Purpose: increase insight and regain self-worth  Name: Rodney Nelson Date of Birth: 1980/12/26  MR: [id number]    Level of Participation: active Quality of Participation: cooperative Interactions with others: gave feedback Mood/Affect: appropriate Triggers (if applicable):  n a Cognition: coherent/clear Progress: Gaining insight Response:  n a Plan: follow-up needed  Patients Problems:  Patient Active Problem List   Diagnosis Date Noted   Polysubstance abuse (HCC) 01/07/2024

## 2024-01-07 NOTE — Group Note (Signed)
 Group Topic: Communication  Group Date: 01/07/2024 Start Time: 1245 End Time: 1330 Facilitators: Alyse Leilani LABOR, NT  Department: [facility]  Number of Participants: 7  Group Focus: chemical dependency issues, clarity of thought, and communication Treatment Modality:  Psychoeducation Interventions utilized were group exercise, problem solving, and support Purpose: enhance coping skills, relapse prevention strategies, and trigger / craving management  Name: Stran Raper Date of Birth: 24-Sep-1980  MR: [id number]   Pt was not on the unit Patients Problems:  Patient Active Problem List   Diagnosis Date Noted   Polysubstance abuse (HCC) 01/07/2024

## 2024-01-07 NOTE — Progress Notes (Signed)
" °   01/07/24 1310  [facility] Triage Screening (Walk-ins at [facility] only)  What Is the Reason for Your Visit/Call Today? Rodney Nelson 59y male presents to [facility] unaccompanied. PT states he is here so he can go to [facility]. PT states he is here to detox off of drugs and alcohol. PT states he uses every other day and the amount is a lot. PT denies SI, HI, and AVH.  How Long Has This Been Causing You Problems? > than 6 months  Have You Recently Had Any Thoughts About Hurting Yourself? No  Are You Planning to Commit Suicide/Harm Yourself At This time? No  Have you Recently Had Thoughts About Hurting Someone [name]? No  Are You Planning To Harm Someone At This Time? No  Physical Abuse Denies  Verbal Abuse Denies  Sexual Abuse Denies  Exploitation of patient/patient's resources Denies  Self-Neglect Denies  Are you currently experiencing any auditory, visual or other hallucinations? No  Have You Used Any Alcohol or Drugs in the Past 24 Hours? Yes  What Did You Use and How Much? cocaine (1g), alcohol (fifth of liquor), marijuana (an eighth)  Do you have any current medical co-morbidities that require immediate attention? No  Clinician description of patient physical appearance/behavior: calm, cooperative, dressed casually poorly groomed  What Do You Feel Would Help You the Most Today? Alcohol or Drug Use Treatment  Determination of Need Routine (7 days)  Options For Referral Facility-Based Crisis;Outpatient Therapy    "

## 2024-01-07 NOTE — ED Notes (Signed)
 Pt provided with extra blanket as requested. Pt ate dinner. No other complaints or concerns reported to RN. No signs of distress observed.

## 2024-01-07 NOTE — Care Management (Signed)
 [facility] Care Management...  Writer met with the client to discuss discharge planning.  Writer informed the client the average stay at [facility] is 3-5 days.  Client reports he's homeless and is seeking substance abuse treatment.  Client reports using cocaine, alcohol, and cannabis.  Client reports he's interested in completing 30 day treatment at [facility].    Writer informed the client she faxed out a referral to [place], [facility], and [facility] [place]. Client reports he's only interested in [facility].  Writer informed the client last week they had a wait time of over one week.  Writer inquired about where the Client would stay if he's discharged before an opening is available at [facility].  Client reports he has no where he can go.  Client reports he's currently on probation and has a meeting with his P.O. in two weeks.  Client reports he has not discussed limits regarding his probation on where he can receive residential treatment.  Client reports no SI/HI and or plans.

## 2024-01-07 NOTE — ED Notes (Signed)
 RN spoke with patient A&Ox4. Denies intent to harm self/others when asked. Denies A/VH or any physical complaints when asked. No acute distress noted. Active listening, support and encouragement provided. Routine safety checks conducted according to facility protocol. Encouraged patient to notify staff if thoughts of harm toward self or others arise. Patient verbalize understanding and agreement. Refreshments offered, toiletries and active listening.

## 2024-01-07 NOTE — ED Notes (Signed)
 Pt oriented to unit, questions denied. Skin assessment- tattoos, scars bilateral knees, and dry skin present, pink colored abrasions on face.

## 2024-01-08 DIAGNOSIS — F1914 Other psychoactive substance abuse with psychoactive substance-induced mood disorder: Secondary | ICD-10-CM | POA: Diagnosis not present

## 2024-01-08 DIAGNOSIS — F1010 Alcohol abuse, uncomplicated: Secondary | ICD-10-CM | POA: Diagnosis not present

## 2024-01-08 DIAGNOSIS — F1410 Cocaine abuse, uncomplicated: Secondary | ICD-10-CM | POA: Diagnosis not present

## 2024-01-08 DIAGNOSIS — F32A Depression, unspecified: Secondary | ICD-10-CM | POA: Diagnosis not present

## 2024-01-08 NOTE — Group Note (Signed)
 Group Topic: Relapse and Recovery  Group Date: 01/08/2024 Start Time: 1100 End Time: 1200 Facilitators: Vanisha Whiten, LCSW  Department: [facility]  Number of Participants: 5  Group Focus: acceptance, anxiety, chemical dependency issues, community group, and substance abuse education Treatment Modality:  Cognitive Behavioral Therapy and Solution-Focused Therapy Interventions utilized were group exercise and support Purpose: enhance coping skills, express feelings, express irrational fears, regain self-worth, relapse prevention strategies, and trigger / craving management  Writer met with the Group to discuss holiday's and Recovery.  Group members were asked to pick one word describing this year and how substance abuse impacted their goals and decisions.  Writer utilized CBT and Solution Focus therapy to explore triggers and goals for 2026. Name: Rodney Nelson Date of Birth: May 30, 1980  MR: [id number]    Level of Participation: Client did not attend.  Client was encouraged. Quality of Participation: NA Interactions with others: NA Mood/Affect: NA Triggers (if applicable): NA Cognition: NA Progress: Other Response: NA Plan: Client will continue to be encouraged to attend group   Patients Problems:  Patient Active Problem List   Diagnosis Date Noted   Polysubstance abuse (HCC) 01/07/2024

## 2024-01-08 NOTE — ED Notes (Signed)
 Patient resting but easily aroused. No s/s of distress.

## 2024-01-08 NOTE — ED Notes (Signed)
 Pt generally isolative, however, did attend dayroom for lunch. No concerns or complaints reported to RN. No signs of distress observed.

## 2024-01-08 NOTE — ED Notes (Signed)
 Patient is in the bedroom calm and sleeping,  NAD.  Environment secured per policy. Respirations even and unlabored.  Will monitor for safety.

## 2024-01-08 NOTE — Group Note (Signed)
 Group Topic: Wellness  Group Date: 01/08/2024 Start Time: 1200 End Time: 1220 Facilitators: Daved Tinnie HERO, RN  Department: [facility]  Number of Participants: 6  Group Focus: nursing group Treatment Modality:  Psychoeducation Interventions utilized were patient education Purpose: increase insight  Name: Rodney Nelson Date of Birth: [birth year]  MR: [id number]    Level of Participation: pt did not attend group  Plan: patient will be encouraged to attend future RN education gorups  Patients Problems:  Patient Active Problem List   Diagnosis Date Noted   Polysubstance abuse (HCC) 01/07/2024

## 2024-01-08 NOTE — Care Plan (Signed)
 Interdisciplinary Treatment and Diagnostic Plan Update  01/08/2024 Time of Session: 2pm Rodney Nelson MRN: [id number]  Diagnosis:  Final diagnoses:  None     Current Medications:  Current Facility-Administered Medications  Medication Dose Route Frequency Provider Last Rate Last Admin   acetaminophen  (TYLENOL ) tablet 650 mg  650 mg Oral Q6H PRN Randall Starlyn HERO, NP       alum & mag hydroxide-simeth (MAALOX/MYLANTA) 200-200-20 MG/5ML suspension 30 mL  30 mL Oral Q4H PRN Randall, Veronique M, NP       haloperidol (HALDOL) tablet 5 mg  5 mg Oral TID PRN Byungura, Veronique M, NP       And   diphenhydrAMINE (BENADRYL) capsule 50 mg  50 mg Oral TID PRN Byungura, Veronique M, NP       haloperidol lactate (HALDOL) injection 5 mg  5 mg Intramuscular TID PRN Byungura, Veronique M, NP       And   diphenhydrAMINE (BENADRYL) injection 50 mg  50 mg Intramuscular TID PRN Randall Starlyn HERO, NP       And   LORazepam  (ATIVAN ) injection 2 mg  2 mg Intramuscular TID PRN Byungura, Veronique M, NP       haloperidol lactate (HALDOL) injection 10 mg  10 mg Intramuscular TID PRN Randall Starlyn HERO, NP       And   diphenhydrAMINE (BENADRYL) injection 50 mg  50 mg Intramuscular TID PRN Randall Starlyn HERO, NP       And   LORazepam  (ATIVAN ) injection 2 mg  2 mg Intramuscular TID PRN Randall, Veronique M, NP       magnesium hydroxide (MILK OF MAGNESIA) suspension 30 mL  30 mL Oral Daily PRN Randall Starlyn HERO, NP       No current outpatient medications on file.   PTA Medications: Prior to Admission medications  Not on File    Patient Stressors: Substance abuse    Patient Strengths: Capable of independent living  Motivation for treatment/growth   Treatment Modalities: Medication Management, Group therapy, Case management,  1 to 1 session with clinician, Psychoeducation, Recreational therapy.   Physician Treatment Plan for Primary and Secondary Diagnosis:  Final diagnoses:  None    Long Term Goal(s):    Short Term Goals:    Medication Management: Evaluate patient's response, side effects, and tolerance of medication regimen.  Therapeutic Interventions: 1 to 1 sessions, Unit Group sessions and Medication administration.  Evaluation of Outcomes: Progressing  LCSW Treatment Plan for Primary Diagnosis:  Final diagnoses:  None    Long Term Goal(s): Safe transition to appropriate next level of care at discharge.  Short Term Goals: Facilitate acceptance of mental health diagnosis and concerns through verbal commitment to aftercare plan and appointments at discharge.  Therapeutic Interventions: Assess for all discharge needs, 1 to 1 time with Child psychotherapist, Explore available resources and support systems, Assess for adequacy in community support network, Educate family and significant other(s) on suicide prevention, Complete Psychosocial Assessment, Interpersonal group therapy.  Evaluation of Outcomes: Progressing   Progress in Treatment: Attending groups: Yes. Participating in groups: Yes. Taking medication as prescribed: Yes. Toleration medication: No. Family/Significant other contact made: No Patient understands diagnosis: Yes. Discussing patient identified problems/goals with staff: Yes. Medical problems stabilized or resolved: Yes. Denies suicidal/homicidal ideation: Yes. Issues/concerns per patient self-inventory: No Other: na  New problem(s) identified: No, Describe:     New Short Term/Long Term Goal(s):  I want to go to a 30 day treatment program after I complete my detox  at [facility].    Patient Goals:  Client initially wanted to go to [facility].  [facility] is no longer accepting new clients.  Writer has informed the client he can go to [facility] or [facility].  Client reports he will complete the phone assessments to check his eligibility.    Discharge Plan or Barriers: Client reports no barriers and his discharge plan is to complete his detox and go into  residential treatment.   Reason for Continuation of Hospitalization: Anxiety Depression Withdrawal symptoms  Estimated Length of Stay: 01/07/24 -01/10/24  Last 3 [place] Suicide Severity Risk Score: Flowsheet Row ED from 01/07/2024 in [facility] Most recent reading at 01/07/2024  3:45 PM ED from 01/07/2024 in [facility] Most recent reading at 01/07/2024  1:15 PM ED from 09/30/2023 in [facility] Emergency Department at [facility] Most recent reading at 09/30/2023  1:40 PM  C-SSRS RISK CATEGORY No Risk No Risk No Risk    Last PHQ 2/9 Scores:    01/07/2024    1:46 PM  Depression screen PHQ 2/9  Decreased Interest 0  Down, Depressed, Hopeless 0  PHQ - 2 Score 0    Scribe for Treatment Team: Makeda Peeks, LCSW 01/08/2024 5:26 PM

## 2024-01-08 NOTE — Care Management (Addendum)
 [facility] Care Management...  Addendum 12:52  Writer spoke with Jan @ [facility] Patient needs to call to complete pre-screen  Patient met with Harlene and Cathlyn ([facility]) to connect with community support  Writer met with patient to discuss discharge planning..  Patient reported wanting inpatient with [facility]  Writer spoke to [facility], they are at capacity and are not scheduling any new patients. Olivia also web designer, that they are going to be in the process of moving with-in the next few days  Writer met with patient and provided an update regarding Community Education Officer provided patient with number for [facility] to complete phone interview  Writer will connect patient to Jessica/Bri ([facility]) to assist with community resources

## 2024-01-08 NOTE — ED Provider Notes (Cosign Needed)
 Behavioral Health Progress Note  Date and Time: 01/08/2024 12:28 PM Name: Rodney Nelson MRN:  [id number]  HPI: Rodney Nelson is a 43 y.o. male with a history of polysubstance abuse (cocaine, alcohol and THC) who presented to the [facility] on 01/07/24 seeking detox. He reported being recently discharged from the [facility] & relapsed after a week. Reported current homelessness s/p being kicked out by his mither secondary to his recurrent substance use.  Assessment, 01/08/24: During encounter today, pt is irritable, asking writer why are y'all asking me the same questions over and over again? Writer educated him that it is necessary so as to ensure that his story is consistent and that we are not missing anything in his care. Writer proceeded with questioning pt regarding his substance abuse, and she shared that he drinks 1 to 2 beers daily, and that he has been drinking since 43yrs old. He shares that he has also been using cocaine and THC since he was 43 yrs old, and uses these substances because I want to enjoy myself. Pt reports being interested in rehabilitation, is unable to pinpoint what work in intends to do on his own to assist with his sobriety. There is a strong possibility that this patient might be malingering for secondary gains of shelter and food, but we will continue to monitor.  Today, he denies signs/symptoms related to withdrawals, denies cravings for substances of abuse, denies SI, denies HI, denies AVH, denies paranoia and denies delusional thinking. There are no overt signs of psychosis, and pt educated on the need to attend unit group sessions. He verbalizes not being interested in medication management of mental health problems, states I ain't mental.  Per CSW's note from yesterday's encounter, pt is only interested in going to [facility] and nowhere else. We will let pt know that he will be unable to  stay on the [facility] for an extensive period of time if he is not willing to go to other facilities which are willing to accept him.  Labs Reviewed: Educated on healthy food choices and exercise and on the need to establish care with a PCP to continue to monitor his cholesterol numbers. Triglycerides are 279.  Diagnosis:  Final diagnoses:  None   Total Time spent with patient: 45 minutes  Past Psychiatric History: polysubstance abuse  Past Medical History: No past medical history on file.  Family History:  Family History  Problem Relation Age of Onset   Hypertension Mother    Stroke Mother    Hypertension Father     Family Psychiatric  History: denies Social History:  Social History   Socioeconomic History   Marital status: Significant Other    Spouse name: Not on file   Number of children: Not on file   Years of education: Not on file   Highest education level: Not on file  Occupational History   Not on file  Tobacco Use   Smoking status: Every Day    Current packs/day: 0.50    Types: Cigarettes   Smokeless tobacco: Never  Substance and Sexual Activity   Alcohol use: Yes    Comment: occ   Drug use: Yes    Types: Marijuana, Cocaine   Sexual activity: Not on file  Other Topics Concern   Not on file  Social History Narrative   Not on file   Social Drivers of Health   Tobacco Use: High Risk (09/20/2023)   Patient History  Smoking Tobacco Use: Every Day    Smokeless Tobacco Use: Never    Passive Exposure: Not on file  Financial Resource Strain: Not on file  Food Insecurity: No Food Insecurity (01/07/2024)   Epic    Worried About Programme Researcher, Broadcasting/film/video in the Last Year: Never true    Ran Out of Food in the Last Year: Never true  Transportation Needs: No Transportation Needs (01/07/2024)   Epic    Lack of Transportation (Medical): No    Lack of Transportation (Non-Medical): No  Physical Activity: Not on file  Stress: Not on file  Social Connections: Not on file   Intimate Partner Violence: Not At Risk (01/07/2024)   Epic    Fear of Current or Ex-Partner: No    Emotionally Abused: No    Physically Abused: No    Sexually Abused: No  Depression (PHQ2-9): Low Risk (01/07/2024)   Depression (PHQ2-9)    PHQ-2 Score: 0  Alcohol Screen: Not on file  Housing: Not on file  Utilities: Not At Risk (01/07/2024)   Epic    Threatened with loss of utilities: No  Health Literacy: Not on file     Sleep: Good  Appetite:  Good  Current Medications:  Current Facility-Administered Medications  Medication Dose Route Frequency Provider Last Rate Last Admin   acetaminophen  (TYLENOL ) tablet 650 mg  650 mg Oral Q6H PRN Randall, Veronique M, NP       alum & mag hydroxide-simeth (MAALOX/MYLANTA) 200-200-20 MG/5ML suspension 30 mL  30 mL Oral Q4H PRN Randall, Veronique M, NP       haloperidol (HALDOL) tablet 5 mg  5 mg Oral TID PRN Byungura, Veronique M, NP       And   diphenhydrAMINE (BENADRYL) capsule 50 mg  50 mg Oral TID PRN Byungura, Veronique M, NP       haloperidol lactate (HALDOL) injection 5 mg  5 mg Intramuscular TID PRN Byungura, Veronique M, NP       And   diphenhydrAMINE (BENADRYL) injection 50 mg  50 mg Intramuscular TID PRN Randall Starlyn HERO, NP       And   LORazepam  (ATIVAN ) injection 2 mg  2 mg Intramuscular TID PRN Randall Starlyn HERO, NP       haloperidol lactate (HALDOL) injection 10 mg  10 mg Intramuscular TID PRN Randall Starlyn HERO, NP       And   diphenhydrAMINE (BENADRYL) injection 50 mg  50 mg Intramuscular TID PRN Randall Starlyn HERO, NP       And   LORazepam  (ATIVAN ) injection 2 mg  2 mg Intramuscular TID PRN Randall, Veronique M, NP       magnesium hydroxide (MILK OF MAGNESIA) suspension 30 mL  30 mL Oral Daily PRN Randall Starlyn HERO, NP       No current outpatient medications on file.    Labs  Lab Results:  Admission on 01/07/2024, Discharged on 01/07/2024  Component Date Value Ref Range Status   WBC  01/07/2024 6.6  4.0 - 10.5 K/uL Final   RBC 01/07/2024 4.50  4.22 - 5.81 MIL/uL Final   Hemoglobin 01/07/2024 14.4  13.0 - 17.0 g/dL Final   HCT 87/69/7974 42.6  39.0 - 52.0 % Final   MCV 01/07/2024 94.7  80.0 - 100.0 fL Final   MCH 01/07/2024 32.0  26.0 - 34.0 pg Final   MCHC 01/07/2024 33.8  30.0 - 36.0 g/dL Final   RDW 87/69/7974 13.5  11.5 - 15.5 % Final  Platelets 01/07/2024 206  150 - 400 K/uL Final   Comment: REPEATED TO VERIFY SPECIMEN CHECKED FOR CLOTS    nRBC 01/07/2024 0.0  0.0 - 0.2 % Final   Neutrophils Relative % 01/07/2024 46  % Final   Neutro Abs 01/07/2024 3.0  1.7 - 7.7 K/uL Final   Lymphocytes Relative 01/07/2024 40  % Final   Lymphs Abs 01/07/2024 2.6  0.7 - 4.0 K/uL Final   Monocytes Relative 01/07/2024 11  % Final   Monocytes Absolute 01/07/2024 0.7  0.1 - 1.0 K/uL Final   Eosinophils Relative 01/07/2024 3  % Final   Eosinophils Absolute 01/07/2024 0.2  0.0 - 0.5 K/uL Final   Basophils Relative 01/07/2024 0  % Final   Basophils Absolute 01/07/2024 0.0  0.0 - 0.1 K/uL Final   Immature Granulocytes 01/07/2024 0  % Final   Abs Immature Granulocytes 01/07/2024 0.02  0.00 - 0.07 K/uL Final   Performed at [facility], [address] [place], [place] [address]   Sodium 01/07/2024 138  135 - 145 mmol/L Final   Potassium 01/07/2024 4.2  3.5 - 5.1 mmol/L Final   Chloride 01/07/2024 102  98 - 111 mmol/L Final   CO2 01/07/2024 25  22 - 32 mmol/L Final   Glucose, Bld 01/07/2024 115 (H)  70 - 99 mg/dL Final   Glucose reference range applies only to samples taken after fasting for at least 8 hours.   BUN 01/07/2024 18  6 - 20 mg/dL Final   Creatinine, Ser 01/07/2024 0.99  0.61 - 1.24 mg/dL Final   Calcium 87/69/7974 9.4  8.9 - 10.3 mg/dL Final   Total Protein 87/69/7974 7.4  6.5 - 8.1 g/dL Final   Albumin 87/69/7974 4.3  3.5 - 5.0 g/dL Final   AST 87/69/7974 49 (H)  15 - 41 U/L Final   HEMOLYSIS AT THIS LEVEL MAY AFFECT RESULT   ALT 01/07/2024 49 (H)  0 - 44 U/L  Final   Alkaline Phosphatase 01/07/2024 102  38 - 126 U/L Final   Total Bilirubin 01/07/2024 0.4  0.0 - 1.2 mg/dL Final   GFR, Estimated 01/07/2024 >60  >60 mL/min Final   Comment: (NOTE) Calculated using the CKD-EPI Creatinine Equation (2021)    Anion gap 01/07/2024 11  5 - 15 Final   Performed at [facility], [address], [place], [place] [address]   Cholesterol 01/07/2024 230 (H)  0 - 200 mg/dL Final   Comment:        ATP III CLASSIFICATION:  <200     mg/dL   Desirable  799-760  mg/dL   Borderline High  >=759    mg/dL   High           Triglycerides 01/07/2024 279 (H)  <150 mg/dL Final   HDL 87/69/7974 54  >40 mg/dL Final   Total CHOL/HDL Ratio 01/07/2024 4.3  RATIO Final   VLDL 01/07/2024 56 (H)  0 - 40 mg/dL Final   LDL Cholesterol 01/07/2024 120 (H)  0 - 99 mg/dL Final   Comment:        Total Cholesterol/HDL:CHD Risk Coronary Heart Disease Risk Table                     Men   Women  1/2 Average Risk   3.4   3.3  Average Risk       5.0   4.4  2 X Average Risk   9.6  7.1  3 X Average Risk  23.4   11.0        Use the calculated Patient Ratio above and the CHD Risk Table to determine the patient's CHD Risk.        ATP III CLASSIFICATION (LDL):  <100     mg/dL   Optimal  899-870  mg/dL   Near or Above                    Optimal  130-159  mg/dL   Borderline  839-810  mg/dL   High  >809     mg/dL   Very High Performed at [facility], [address], [place], [place] [address]    Alcohol, Ethyl (B) 01/07/2024 <15  <15 mg/dL Final   Comment: (NOTE) For medical purposes only. Performed at [facility], [address], [place], [place] [address]    Magnesium 01/07/2024 1.9  1.7 - 2.4 mg/dL Final   Performed at [facility], [address], [place], [place] [address]   Hgb A1c MFr Bld 01/07/2024 5.6  4.8 - 5.6 % Final   Comment: (NOTE) Diagnosis of Diabetes The following HbA1c ranges recommended by the American Diabetes Association (ADA)  may be used as an aid in the diagnosis of diabetes mellitus.  Hemoglobin             Suggested A1C NGSP%              Diagnosis  <5.7                   Non Diabetic  5.7-6.4                Pre-Diabetic  >6.4                   Diabetic  <7.0                   Glycemic control for                       adults with diabetes.     Mean Plasma Glucose 01/07/2024 114.02  mg/dL Final   Performed at [facility], [address], [place], [place] [address]   TSH 01/07/2024 0.377  0.350 - 4.500 uIU/mL Final   Performed at [facility], [address], [place], [place] [address]   POC Amphetamine UR 01/07/2024 None Detected  NONE DETECTED (Cut Off Level 1000 ng/mL) Final   POC Secobarbital (BAR) 01/07/2024 None Detected  NONE DETECTED (Cut Off Level 300 ng/mL) Final   POC Buprenorphine (BUP) 01/07/2024 None Detected  NONE DETECTED (Cut Off Level 10 ng/mL) Final   POC Oxazepam (BZO) 01/07/2024 None Detected  NONE DETECTED (Cut Off Level 300 ng/mL) Final   POC Cocaine UR 01/07/2024 Positive (A)  NONE DETECTED (Cut Off Level 300 ng/mL) Final   POC Methamphetamine UR 01/07/2024 None Detected  NONE DETECTED (Cut Off Level 1000 ng/mL) Final   POC Morphine 01/07/2024 None Detected  NONE DETECTED (Cut Off Level 300 ng/mL) Final   POC Methadone UR 01/07/2024 None Detected  NONE DETECTED (Cut Off Level 300 ng/mL) Final   POC Oxycodone  UR 01/07/2024 None Detected  NONE DETECTED (Cut Off Level 100 ng/mL) Final   POC Marijuana UR 01/07/2024 Positive (A)  NONE DETECTED (Cut Off Level 50 ng/mL) Final  Admission on 09/20/2023, Discharged on 09/20/2023  Component Date  Value Ref Range Status   Sodium 09/20/2023 139  135 - 145 mmol/L Final   Potassium 09/20/2023 3.5  3.5 - 5.1 mmol/L Final   Chloride 09/20/2023 101  98 - 111 mmol/L Final   CO2 09/20/2023 26  22 - 32 mmol/L Final   Glucose, Bld 09/20/2023 130 (H)  70 - 99 mg/dL Final   Glucose reference range applies only to samples taken after  fasting for at least 8 hours.   BUN 09/20/2023 11  6 - 20 mg/dL Final   Creatinine, Ser 09/20/2023 0.74  0.61 - 1.24 mg/dL Final   Calcium 90/87/7974 8.9  8.9 - 10.3 mg/dL Final   GFR, Estimated 09/20/2023 >60  >60 mL/min Final   Comment: (NOTE) Calculated using the CKD-EPI Creatinine Equation (2021)    Anion gap 09/20/2023 13  5 - 15 Final   Performed at [facility], [address] [place], [place] [address]   WBC 09/20/2023 6.1  4.0 - 10.5 K/uL Final   REPEATED TO VERIFY   RBC 09/20/2023 4.00 (L)  4.22 - 5.81 MIL/uL Final   Hemoglobin 09/20/2023 12.7 (L)  13.0 - 17.0 g/dL Final   HCT 90/87/7974 37.6 (L)  39.0 - 52.0 % Final   MCV 09/20/2023 94.0  80.0 - 100.0 fL Final   MCH 09/20/2023 31.8  26.0 - 34.0 pg Final   MCHC 09/20/2023 33.8  30.0 - 36.0 g/dL Final   RDW 90/87/7974 14.3  11.5 - 15.5 % Final   Platelets 09/20/2023 240  150 - 400 K/uL Final   nRBC 09/20/2023 0.3 (H)  0.0 - 0.2 % Final   Performed at [facility], [address] [place], [place] [address]   Troponin T High Sensitivity 09/20/2023 <15  0 - 19 ng/L Final   Comment: (NOTE) Biotin concentrations > 1000 ng/mL falsely decrease TnT results.  Serial cardiac troponin measurements are suggested.  Refer to the Links section for chest pain algorithms and additional  guidance. Performed at [facility], [address] [place], [place] [address]     Blood Alcohol level:  Lab Results  Component Value Date   [facility] <15 01/07/2024   ETH <10 02/04/2022    Metabolic Disorder Labs: Lab Results  Component Value Date   HGBA1C 5.6 01/07/2024   MPG 114.02 01/07/2024   No results found for: PROLACTIN Lab Results  Component Value Date   CHOL 230 (H) 01/07/2024   TRIG 279 (H) 01/07/2024   HDL 54 01/07/2024   CHOLHDL 4.3 01/07/2024   VLDL 56 (H) 01/07/2024   LDLCALC 120 (H) 01/07/2024    Therapeutic Lab Levels: No results found for: LITHIUM No results found for:  VALPROATE No results found for: CBMZ  Physical Findings   PHQ2-9    Flowsheet Row ED from 01/07/2024 in [facility]  PHQ-2 Total Score 0   Flowsheet Row ED from 01/07/2024 in [facility] Most recent reading at 01/07/2024  3:45 PM ED from 01/07/2024 in [facility] Most recent reading at 01/07/2024  1:15 PM ED from 09/30/2023 in [facility] Emergency Department at [facility] Most recent reading at 09/30/2023  1:40 PM  C-SSRS RISK CATEGORY No Risk No Risk No Risk     Musculoskeletal  Strength & Muscle Tone: within normal limits Gait & Station: normal Patient leans: N/A  Psychiatric Specialty Exam  Presentation  General Appearance:  Casual  Eye  Contact: Fair  Speech: Clear and Coherent  Speech Volume: Normal  Handedness: Right   Mood and Affect  Mood: Anxious; Irritable  Affect: Congruent   Thought Process  Thought Processes: Coherent  Descriptions of Associations:Intact  Orientation:Full (Time, Place and Person)  Thought Content:Logical  Diagnosis of Schizophrenia or Schizoaffective disorder in past: No    Hallucinations:Hallucinations: None  Ideas of Reference:None  Suicidal Thoughts:Suicidal Thoughts: No  Homicidal Thoughts:Homicidal Thoughts: No   Sensorium  Memory: Immediate Fair  Judgment: Fair  Insight: Fair   Art Therapist  Concentration: Fair  Attention Span: Fair  Recall: Fair  Fund of Knowledge: Fair  Language: Fair   Psychomotor Activity  Psychomotor Activity: Psychomotor Activity: Normal   Assets  Assets: Resilience   Sleep  Sleep: Sleep: Good  Estimated Sleeping Duration (Last 24 Hours): 15.50-17.00 hours  Nutritional Assessment (For OBS and [facility] admissions only) Has the patient had a weight loss or gain of 10 pounds or more in the last 3 months?: No Has the patient had a decrease in food  intake/or appetite?: No Does the patient have dental problems?: No Does the patient have eating habits or behaviors that may be indicators of an eating disorder including binging or inducing vomiting?: No Has the patient recently lost weight without trying?: 0 Has the patient been eating poorly because of a decreased appetite?: 0 Malnutrition Screening Tool Score: 0    Physical Exam  Physical Exam Constitutional:      Appearance: Normal appearance.  Eyes:     Pupils: Pupils are equal, round, and reactive to light.  Musculoskeletal:        General: Normal range of motion.     Cervical back: Normal range of motion.  Neurological:     Mental Status: He is alert.  Psychiatric:        Mood and Affect: Mood normal.        Behavior: Behavior normal.        Thought Content: Thought content normal.    Review of Systems  Psychiatric/Behavioral:  Positive for depression and substance abuse. Negative for hallucinations, memory loss and suicidal ideas. The patient is not nervous/anxious and does not have insomnia.   All other systems reviewed and are negative.  Blood pressure 108/83, pulse 66, temperature 97.9 F (36.6 C), resp. rate 18, SpO2 100%. There is no height or weight on file to calculate BMI.  Treatment Plan Summary: Daily contact with patient to assess and evaluate symptoms and progress in treatment and Medication management  Meds-Non receptive to psychotropic management of symptoms.  PRNS -Continue Tylenol  650 mg every 6 hours PRN for mild pain -Continue Maalox 30 mg every 4 hrs PRN for indigestion -Continue Milk of Magnesia as needed every 6 hrs for constipation -Continue Abuterol inhaler every 6 hours PRN for wheezing/SOB  Labs Reviewed: No new orders placed  Discharge Planning: Social work and case management to assist with discharge planning and identification of hospital follow-up needs prior to discharge Estimated LOS: 3-5 days Discharge Concerns: Need to  establish a safety plan; Medication compliance and effectiveness Discharge Goals: Return home with outpatient referrals for mental health follow-up including medication management/psychotherapy     Total Time Spent in Direct Patient Care:  I personally spent 50 minutes on the unit in direct patient care. The direct patient care time included face-to-face time with the patient, reviewing the patient's chart, communicating with other professionals, and coordinating care. Greater than 50% of this time was spent in counseling or coordinating  care with the patient regarding goals of hospitalization, psycho-education, and discharge planning needs.   Donia Snell, NP 01/08/2024 12:28 PM

## 2024-01-09 DIAGNOSIS — F1010 Alcohol abuse, uncomplicated: Secondary | ICD-10-CM | POA: Diagnosis not present

## 2024-01-09 DIAGNOSIS — F32A Depression, unspecified: Secondary | ICD-10-CM | POA: Diagnosis not present

## 2024-01-09 DIAGNOSIS — F1410 Cocaine abuse, uncomplicated: Secondary | ICD-10-CM | POA: Diagnosis not present

## 2024-01-09 DIAGNOSIS — F1914 Other psychoactive substance abuse with psychoactive substance-induced mood disorder: Secondary | ICD-10-CM | POA: Diagnosis not present

## 2024-01-09 MED ORDER — WHITE PETROLATUM EX OINT: TOPICAL_OINTMENT | Freq: Every day | CUTANEOUS | Status: DC

## 2024-01-09 MED ORDER — WHITE PETROLATUM EX OINT: TOPICAL_OINTMENT | CUTANEOUS | Status: DC | PRN

## 2024-01-09 NOTE — Care Management (Signed)
 [facility] Care Management...  Writer met with the client to discuss discharge planning.  Writer informed the client [facility] will not be transporting clients to their facility today due to the holiday.  Writer informed the client safe transport through [facility] can transport him.  Client requested safe transport to take him to [facility].  Writer has requested safe transport take him before 11am but they report it's not guaranteed they can have he picked up by 11am.

## 2024-01-09 NOTE — ED Notes (Signed)
 Patient A&O x 4, ambulatory. Patient discharged in no acute distress. Patient denied SI/HI, A/VH upon discharge. Patient verbalized understanding of all discharge instructions reviewed on AVS via staff, to include follow up appointments, RX's and safety. Suicide safety plan completed and reviewed with Clinical research associate. A copy given to pt. Pt belongings returned to patient from locker intact. Patient escorted to lobby via staff for transport to destination. Safety maintained.

## 2024-01-09 NOTE — Discharge Instructions (Addendum)
 [facility] Care Management..   Patient has been accepted to [facility] for inpatient treatment  [facility] [address] [place],  [place] [address] Phone [phone]  RN to arrange transportation  30 day scripts

## 2024-01-09 NOTE — Care Management (Signed)
 [facility] Care Management..   Patient has been accepted to [facility] for inpatient treatment  [facility] [address] [place],  [place] [address] Phone [phone]  RN to arrange transportation  30 day scripts

## 2024-01-09 NOTE — ED Notes (Signed)
 Patient asleep, NAD. Will continue to monitor for safety.

## 2024-01-09 NOTE — ED Notes (Signed)
"  Patient asleep at this time   "

## 2024-01-09 NOTE — ED Provider Notes (Signed)
 [facility]BC/OBS ASAP Discharge Summary  Date and Time: 01/09/2024 10:18 AM  Name: Rodney Nelson  MRN:  [id number]   Discharge Diagnoses:  Final diagnoses:  Substance induced mood disorder (HCC)   HPI: Rodney Nelson is a 44 y.o. male with a history of polysubstance abuse (cocaine, alcohol and THC) who presented to the [facility] on 01/07/24 seeking detox. He reported being recently discharged from the [facility] & relapsed after a week. Reported current homelessness s/p being kicked out by his mother secondary to his recurrent substance use.   Stay Summary: Patient initially presented with some irritability on presentation to the [facility], but became more receptive with unit programming and activities the next day, and was agreeable to being referred into rehabilitation programs. He cited a goal of being able to regain and sustain his sobriety.   Patient was agreeable, and referred to the [facility] treatment [facility] in [place], where he was accepted for a presentation date of today, 01/09/2024. Pt was not agreeable to being started on any psychotropic medications at the time of admission to the [facility], so he was not started on any, and is therefore, not being discharged on any medications. He stated on presentation that he enjoys the recreational substances, and uses due to that. Denied mental health etiologies being the reasons for his substance abuse.  Patient is assessed prior to discharge, and presents with a euthymic mood, attention to personal hygiene and grooming is fair, eye contact is good, speech is clear & coherent. Thought contents are organized and logical, and pt currently denies SI/HI/AVH or paranoia. There is no evidence of delusional thoughts. There are no overt signs of psychosis, he denies being in any physical distress, remains motivated to regain and sustain his sobriety.  Labs were reviewed with the patient, and abnormal results were  discussed with the patient.  Education on the following provided and pt agreeable: # It is recommended to the patient to follow up with your outpatient psychiatric provider and PCP even after discharge from the rehab center.  # It was discussed with the patient, the impact of alcohol, drugs, tobacco have on his overall psychiatric and medical wellbeing, and total abstinence from substance use was recommended , and the patient is agreeable to cessation of use.  # Given opportunity to ask questions. Verbalizes feeling comfortable with discharge.    # In the event of worsening symptoms, the patient is instructed to call the crisis hotline, 988/911 and or go to the nearest ED for appropriate evaluation and treatment of symptoms. To follow-up with primary care provider for other medical issues, concerns and or health care needs.  # Patient was discharged with a plan to follow up as noted below:   Discharge Instructions      [facility] Care Management..   Patient has been accepted to [facility] for inpatient treatment  [facility] [address]  [place], [place] [address] Phone [phone]  RN to arrange transportation  30 day scripts      Scripts not provided since pt was not agreeable to medications on admission.  Total Time spent with patient: 1 hour  Past Psychiatric History: none as per patient  Past Medical History: No past medical history on file.  Family History:  Family History  Problem Relation Age of Onset   Hypertension Mother    Stroke Mother    Hypertension Father     Family Psychiatric History: denies  Social History:  Social History   Socioeconomic History   Marital status: Significant Other    Spouse name: Not on file   Number of children: Not on file   Years of education: Not on file   Highest education level: Not on file  Occupational History   Not on file  Tobacco Use   Smoking status: Every Day    Current packs/day:  0.50    Types: Cigarettes   Smokeless tobacco: Never  Substance and Sexual Activity   Alcohol use: Yes    Comment: occ   Drug use: Yes    Types: Marijuana, Cocaine   Sexual activity: Not on file  Other Topics Concern   Not on file  Social History Narrative   Not on file   Social Drivers of Health   Tobacco Use: High Risk (09/20/2023)   Patient History    Smoking Tobacco Use: Every Day    Smokeless Tobacco Use: Never    Passive Exposure: Not on file  Financial Resource Strain: Not on file  Food Insecurity: No Food Insecurity (01/07/2024)   Epic    Worried About Programme Researcher, Broadcasting/film/video in the Last Year: Never true    Ran Out of Food in the Last Year: Never true  Transportation Needs: No Transportation Needs (01/07/2024)   Epic    Lack of Transportation (Medical): No    Lack of Transportation (Non-Medical): No  Physical Activity: Not on file  Stress: Not on file  Social Connections: Not on file  Intimate Partner Violence: Not At Risk (01/07/2024)   Epic    Fear of Current or Ex-Partner: No    Emotionally Abused: No    Physically Abused: No    Sexually Abused: No  Depression (PHQ2-9): Low Risk (01/09/2024)   Depression (PHQ2-9)    PHQ-2 Score: 0  Alcohol Screen: Not on file  Housing: Not on file  Utilities: Not At Risk (01/07/2024)   Epic    Threatened with loss of utilities: No  Health Literacy: Not on file    Tobacco Cessation:  N/A, patient does not currently use tobacco products  Current Medications:  Current Facility-Administered Medications  Medication Dose Route Frequency Provider Last Rate Last Admin   acetaminophen  (TYLENOL ) tablet 650 mg  650 mg Oral Q6H PRN Randall, Veronique M, NP       alum & mag hydroxide-simeth (MAALOX/MYLANTA) 200-200-20 MG/5ML suspension 30 mL  30 mL Oral Q4H PRN Randall, Veronique M, NP       haloperidol (HALDOL) tablet 5 mg  5 mg Oral TID PRN Byungura, Veronique M, NP       And   diphenhydrAMINE (BENADRYL) capsule 50 mg  50 mg Oral  TID PRN Byungura, Veronique M, NP       haloperidol lactate (HALDOL) injection 5 mg  5 mg Intramuscular TID PRN Byungura, Veronique M, NP       And   diphenhydrAMINE (BENADRYL) injection 50 mg  50 mg Intramuscular TID PRN Randall Starlyn HERO, NP       And   LORazepam  (ATIVAN ) injection 2 mg  2 mg Intramuscular TID PRN Randall Starlyn HERO, NP       haloperidol lactate (HALDOL) injection 10 mg  10 mg Intramuscular TID PRN Randall Starlyn HERO, NP       And   diphenhydrAMINE (BENADRYL) injection 50 mg  50 mg Intramuscular TID PRN Randall Starlyn HERO, NP       And   LORazepam  (ATIVAN ) injection 2 mg  2 mg Intramuscular TID PRN Byungura, Veronique M, NP       magnesium hydroxide (MILK OF MAGNESIA) suspension 30 mL  30 mL Oral Daily PRN Randall Starlyn HERO, NP       white petrolatum (VASELINE) gel   Topical Daily [place] Drilling, NP       No current outpatient medications on file.    PTA Medications:  Facility Ordered Medications  Medication   acetaminophen  (TYLENOL ) tablet 650 mg   alum & mag hydroxide-simeth (MAALOX/MYLANTA) 200-200-20 MG/5ML suspension 30 mL   magnesium hydroxide (MILK OF MAGNESIA) suspension 30 mL   haloperidol (HALDOL) tablet 5 mg   And   diphenhydrAMINE (BENADRYL) capsule 50 mg   haloperidol lactate (HALDOL) injection 5 mg   And   diphenhydrAMINE (BENADRYL) injection 50 mg   And   LORazepam  (ATIVAN ) injection 2 mg   haloperidol lactate (HALDOL) injection 10 mg   And   diphenhydrAMINE (BENADRYL) injection 50 mg   And   LORazepam  (ATIVAN ) injection 2 mg   white petrolatum (VASELINE) gel       01/09/2024    9:30 AM 01/07/2024    1:46 PM  Depression screen PHQ 2/9  Decreased Interest 0 0  Down, Depressed, Hopeless 0 0  PHQ - 2 Score 0 0    Flowsheet Row ED from 01/07/2024 in [facility] Most recent reading at 01/07/2024  3:45 PM ED from 01/07/2024 in [facility] Most recent reading at  01/07/2024  1:15 PM ED from 09/30/2023 in [facility] Most recent reading at 09/30/2023  1:40 PM  C-SSRS RISK CATEGORY No Risk No Risk No Risk    Musculoskeletal  Strength & Muscle Tone: within normal limits Gait & Station: normal Patient leans: N/A  Psychiatric Specialty Exam  Presentation  General Appearance:  Appropriate for Environment  Eye Contact: Fair  Speech: Clear and Coherent  Speech Volume: Normal  Handedness: Right   Mood and Affect  Mood: Euthymic  Affect: Congruent   Thought Process  Thought Processes: Coherent  Descriptions of Associations:Intact  Orientation:Full (Time, Place and Person)  Thought Content:Perseveration; Logical  Diagnosis of Schizophrenia or Schizoaffective disorder in past: No    Hallucinations:Hallucinations: None  Ideas of Reference:None  Suicidal Thoughts:Suicidal Thoughts: No  Homicidal Thoughts:Homicidal Thoughts: No   Sensorium  Memory: Immediate Good  Judgment: Good  Insight: Good   Executive Functions  Concentration: Good  Attention Span: Good  Recall: Good  Fund of Knowledge: Good  Language: Good   Psychomotor Activity  Psychomotor Activity: Psychomotor Activity: Normal   Assets  Assets: Communication Skills; Desire for Improvement   Sleep  Sleep: Sleep: Good  Estimated Sleeping Duration (Last 24 Hours): 15.75-16.25 hours  Nutritional Assessment (For OBS and FBC admissions only) Has the patient had a weight loss or gain of 10 pounds or more in the last 3 months?: No Has the patient had a decrease in food intake/or appetite?: No Does the patient have dental problems?: No Does the patient have eating habits or behaviors that may be indicators of an eating disorder including binging or inducing vomiting?: No Has the patient recently lost weight without trying?: 0 Has the patient been eating poorly because of a decreased appetite?:  0 Malnutrition Screening Tool Score: 0    Physical Exam  Physical Exam Vitals and nursing note reviewed.  Neurological:     General: No focal deficit present.     Mental Status: He  is oriented to person, place, and time.  Psychiatric:        Mood and Affect: Mood normal.        Behavior: Behavior normal.        Thought Content: Thought content normal.        Judgment: Judgment normal.    Review of Systems  Psychiatric/Behavioral:  Positive for substance abuse (motivated to cease using). Negative for depression (stable for outpatient management), hallucinations, memory loss and suicidal ideas. The patient is not nervous/anxious (stable) and does not have insomnia.   All other systems reviewed and are negative.  Blood pressure 117/61, pulse 62, temperature 97.8 F (36.6 C), temperature source Oral, resp. rate 17, SpO2 98%. There is no height or weight on file to calculate BMI.  Demographic Factors:  Male, Low socioeconomic status, and Unemployed  Loss Factors: Decrease in vocational status and Financial problems/change in socioeconomic status  Historical Factors: NA  Risk Reduction Factors:   NA  Continued Clinical Symptoms:  Alcohol/Substance Abuse/Dependencies  Cognitive Features That Contribute To Risk:  None    Suicide Risk:  Mild: Denies Suicidal ideation. There are no identifiable plans, no associated intent, mild dysphoria and related symptoms, good self-control (both objective and subjective assessment), few other risk factors, and identifiable protective factors, including available and accessible social support.  Donia Snell, NP 01/09/2024, 10:18 AM

## 2024-01-09 NOTE — Care Management (Addendum)
 [facility] Care Management...  Addendum 8:33 am  Writer spoke with McKayla at [facility], they are not providing transportation today. Limited transportation for Friday 01/10/24  Writer spoke with Ronnald at [employer], they would be able to transport to [facility], She request it takes place between 11 am and 1 pm   Writer met with patient to discuss discharge planning  Patient reported completing phone interview with [facility]  Patient reported he is acceptable and willing to do inpatient at [facility] will contact [facility] to arrange transportation

## 2024-01-09 NOTE — Group Note (Addendum)
 Group Topic: Recovery Basics  Group Date: 01/08/2024 Start Time: 2005 End Time: 2105 Facilitators: Nathanael Moulder, NT  Department: [facility]  Number of Participants: 9  Group Focus: abuse issues, check in, chemical dependency education, chemical dependency issues, co-dependency, community group, relapse prevention, and substance abuse education Treatment Modality:  Psychoeducation Interventions utilized were reminiscence, story telling, and support Purpose: enhance coping skills and relapse prevention strategies  Name: Rodney Nelson Date of Birth: Nov 02, 1980  MR: [id number]    Level of Participation: active Quality of Participation: cooperative Interactions with others: Appropriate Mood/Affect: appropriate Triggers (if applicable): None noticed Cognition: coherent/clear Progress: Gaining insight Response: Appropriate Plan: patient will be encouraged to attend future groups  Patients Problems:  Patient Active Problem List   Diagnosis Date Noted   Polysubstance abuse (HCC) 01/07/2024
